# Patient Record
Sex: Male | Born: 1940 | Race: Asian | Hispanic: No | Marital: Married | Smoking: Never smoker
Health system: Southern US, Community
[De-identification: ages and names within clinical notes are randomized; demographics above are authoritative.]

## PROBLEM LIST (undated history)

## (undated) DIAGNOSIS — E039 Hypothyroidism, unspecified: Secondary | ICD-10-CM

## (undated) DIAGNOSIS — E7800 Pure hypercholesterolemia, unspecified: Secondary | ICD-10-CM

## (undated) DIAGNOSIS — I10 Essential (primary) hypertension: Secondary | ICD-10-CM

## (undated) DIAGNOSIS — I2510 Atherosclerotic heart disease of native coronary artery without angina pectoris: Secondary | ICD-10-CM

## (undated) DIAGNOSIS — E119 Type 2 diabetes mellitus without complications: Secondary | ICD-10-CM

## (undated) DIAGNOSIS — D496 Neoplasm of unspecified behavior of brain: Secondary | ICD-10-CM

## (undated) DIAGNOSIS — J45909 Unspecified asthma, uncomplicated: Secondary | ICD-10-CM

## (undated) HISTORY — DX: Neoplasm of unspecified behavior of brain: D49.6

## (undated) HISTORY — PX: CARDIAC CATHETERIZATION: SHX172

## (undated) HISTORY — PX: TONSILLECTOMY: SUR1361

## (undated) HISTORY — PX: INCISION AND DRAINAGE: SHX5863

---

## 2005-03-26 DIAGNOSIS — D496 Neoplasm of unspecified behavior of brain: Secondary | ICD-10-CM

## 2005-03-26 HISTORY — DX: Neoplasm of unspecified behavior of brain: D49.6

## 2005-05-18 ENCOUNTER — Ambulatory Visit: Payer: Self-pay | Admitting: Gastroenterology

## 2005-06-25 ENCOUNTER — Emergency Department: Payer: Self-pay | Admitting: Emergency Medicine

## 2005-06-26 ENCOUNTER — Other Ambulatory Visit: Payer: Self-pay

## 2005-08-10 ENCOUNTER — Encounter: Payer: Self-pay | Admitting: Family Medicine

## 2005-08-24 ENCOUNTER — Encounter: Payer: Self-pay | Admitting: Family Medicine

## 2006-03-09 ENCOUNTER — Ambulatory Visit: Payer: Self-pay | Admitting: Neurosurgery

## 2006-08-20 ENCOUNTER — Encounter: Payer: Self-pay | Admitting: Family Medicine

## 2006-08-25 ENCOUNTER — Encounter: Payer: Self-pay | Admitting: Family Medicine

## 2006-08-28 ENCOUNTER — Ambulatory Visit: Payer: Self-pay | Admitting: Family Medicine

## 2006-09-24 ENCOUNTER — Encounter: Payer: Self-pay | Admitting: Family Medicine

## 2009-10-17 ENCOUNTER — Ambulatory Visit: Payer: Self-pay | Admitting: Family Medicine

## 2010-06-27 ENCOUNTER — Emergency Department: Payer: Self-pay | Admitting: Emergency Medicine

## 2010-10-30 ENCOUNTER — Ambulatory Visit: Payer: Self-pay | Admitting: Family Medicine

## 2011-11-06 ENCOUNTER — Ambulatory Visit: Payer: Self-pay | Admitting: Family Medicine

## 2011-11-09 ENCOUNTER — Encounter: Payer: Self-pay | Admitting: Family Medicine

## 2011-11-25 ENCOUNTER — Ambulatory Visit: Payer: Self-pay | Admitting: Family Medicine

## 2011-11-25 ENCOUNTER — Encounter: Payer: Self-pay | Admitting: Family Medicine

## 2011-11-29 ENCOUNTER — Ambulatory Visit: Payer: Self-pay | Admitting: Family Medicine

## 2011-12-25 ENCOUNTER — Ambulatory Visit: Payer: Self-pay | Admitting: Family Medicine

## 2012-05-27 ENCOUNTER — Ambulatory Visit: Payer: Self-pay | Admitting: Family Medicine

## 2012-05-27 LAB — CREATININE, SERUM
EGFR (African American): >60
EGFR (Non-African Amer.): >60

## 2013-11-23 ENCOUNTER — Ambulatory Visit: Payer: Self-pay

## 2014-02-04 ENCOUNTER — Other Ambulatory Visit: Payer: Self-pay | Admitting: Urology

## 2014-02-09 ENCOUNTER — Encounter (HOSPITAL_COMMUNITY): Payer: Self-pay | Admitting: *Deleted

## 2014-02-09 NOTE — Progress Notes (Signed)
Left message on cell phone to go to dr hedrick to obtain new EKG as his last was dated 02.2014.  Pt prefers dr Alex Lambert instead of coming to coming to [place].    Instructed to go on 11.18.2015 if at all possible so that we can get read EKG from doctor

## 2014-02-11 NOTE — Progress Notes (Signed)
Patient is scheduled to have lithotripsy first thing Monday am. He has called Short Stay department multiple times today with various questions related to procedure. Majority of questions answered. There were specific questions he had about his xrays he wanted to speak to Dr. Karsten Ro about. Patient stated he had left message with Dr. Simone Curia office and would very much like to talk to him as soon as possible. This RN called and spoke to [facility] with Dr. Karsten Ro and shared above information and patient's phone number. She stated she would call patient and address his concerns.

## 2014-02-15 ENCOUNTER — Encounter (HOSPITAL_COMMUNITY)
Admission: RE | Disposition: A | Discharge status: Home / Self Care | Payer: Self-pay | Source: Ambulatory Visit | Attending: Urology

## 2014-02-15 ENCOUNTER — Encounter (HOSPITAL_COMMUNITY): Payer: Self-pay | Admitting: General Practice

## 2014-02-15 ENCOUNTER — Ambulatory Visit (HOSPITAL_COMMUNITY)
Admission: RE | Admit: 2014-02-15 | Discharge: 2014-02-15 | Disposition: A | Discharge status: Home / Self Care | Payer: Medicare HMO | Source: Ambulatory Visit | Attending: Urology | Admitting: Urology

## 2014-02-15 ENCOUNTER — Ambulatory Visit (HOSPITAL_COMMUNITY): Payer: Medicare HMO

## 2014-02-15 DIAGNOSIS — E780 Pure hypercholesterolemia: Secondary | ICD-10-CM | POA: Diagnosis not present

## 2014-02-15 DIAGNOSIS — Z7982 Long term (current) use of aspirin: Secondary | ICD-10-CM | POA: Insufficient documentation

## 2014-02-15 DIAGNOSIS — Z87440 Personal history of urinary (tract) infections: Secondary | ICD-10-CM | POA: Insufficient documentation

## 2014-02-15 DIAGNOSIS — I10 Essential (primary) hypertension: Secondary | ICD-10-CM | POA: Insufficient documentation

## 2014-02-15 DIAGNOSIS — I252 Old myocardial infarction: Secondary | ICD-10-CM | POA: Diagnosis not present

## 2014-02-15 DIAGNOSIS — E119 Type 2 diabetes mellitus without complications: Secondary | ICD-10-CM | POA: Diagnosis not present

## 2014-02-15 DIAGNOSIS — E039 Hypothyroidism, unspecified: Secondary | ICD-10-CM | POA: Diagnosis not present

## 2014-02-15 DIAGNOSIS — N201 Calculus of ureter: Secondary | ICD-10-CM

## 2014-02-15 DIAGNOSIS — I2510 Atherosclerotic heart disease of native coronary artery without angina pectoris: Secondary | ICD-10-CM | POA: Insufficient documentation

## 2014-02-15 DIAGNOSIS — N132 Hydronephrosis with renal and ureteral calculous obstruction: Secondary | ICD-10-CM | POA: Diagnosis not present

## 2014-02-15 HISTORY — DX: Hypothyroidism, unspecified: E03.9

## 2014-02-15 HISTORY — DX: Essential (primary) hypertension: I10

## 2014-02-15 HISTORY — DX: Atherosclerotic heart disease of native coronary artery without angina pectoris: I25.10

## 2014-02-15 HISTORY — DX: Pure hypercholesterolemia, unspecified: E78.00

## 2014-02-15 LAB — GLUCOSE, CAPILLARY: Glucose-Capillary: 170 mg/dL — ABNORMAL HIGH (ref 70–99)

## 2014-02-15 SURGERY — LITHOTRIPSY, ESWL
Anesthesia: LOCAL | Laterality: Left

## 2014-02-15 MED ORDER — TAMSULOSIN HCL 0.4 MG PO CAPS: 0.4000 mg | ORAL_CAPSULE | ORAL | Status: DC

## 2014-02-15 MED ORDER — SODIUM CHLORIDE 0.9 % IV SOLN
INTRAVENOUS | Status: DC
  Administered 2014-02-15: 07:00:00 via INTRAVENOUS

## 2014-02-15 MED ORDER — DIPHENHYDRAMINE HCL 25 MG PO CAPS
25.0000 mg | ORAL_CAPSULE | ORAL | Status: AC
  Administered 2014-02-15: 25 mg via ORAL
  Filled 2014-02-15: qty 1

## 2014-02-15 MED ORDER — DIAZEPAM 5 MG PO TABS
10.0000 mg | ORAL_TABLET | ORAL | Status: AC
  Administered 2014-02-15: 10 mg via ORAL
  Filled 2014-02-15: qty 2

## 2014-02-15 MED ORDER — OXYCODONE-ACETAMINOPHEN 10-325 MG PO TABS: 1.0000 | ORAL_TABLET | ORAL | Status: DC | PRN

## 2014-02-15 NOTE — Discharge Instructions (Signed)

## 2014-02-15 NOTE — Op Note (Signed)
See [facility] OP note scanned into chart. Also because of the size, density, location and other factors that cannot be anticipated I feel this will likely be a staged procedure. This fact supersedes any indication in the scanned [facility] operative note to the contrary.  

## 2014-02-15 NOTE — H&P (Signed)
Alex Lambert is a 73 year old male with a left ureteral stone.   History of Present Illness A CT scan done on 11/23/13 revealed a 7 mm wide by 11 mm long stone in the left ureter overlying the sacrum. There was associated proximal hydronephrosis. The stone had Hounsfield units of 1300. In addition he was noted to have prostatic enlargement. A KUB done on 01/06/14 was read as showing a left distal ureteral stone. Images were not available for my independent review.  He told me that in 2/15 he was hospitalized in [place] for what was felt to be a heart condition. His heart checked out fine but an ultrasound at that time revealed a stone in the proximal ureter on the left hand side. When he returned to the [place] he saw Dr. Yves Lambert who we had seen previously for a scrotal abscess in 3/15 and at that time he recommended lithotripsy but also after completing the lithotripsy he had recommended microwave therapy of the prostate. The patient was not sure about undergoing microwave and sought a second opinion by a urologist at [facility] who felt that there was no indication for microwave therapy that was proposed but did recommend ureteroscopic management of the stone by placing a stent and maintaining the patient in the hospital for 3 days followed by discharge and then return for ureteroscopic management of the stone. Again the patient did not want to undergo stent placement and sought a third opinion. It was decided to proceed with lithotripsy and he was scheduled for that today but unfortunately this position was not in his insurance network and it would result in significant expense so he came to see me since I am in his network. He is not having any significant pain at this time. He is also not having any hematuria.   Past Medical History Problems  1. History of CAD (coronary artery disease) (I25.10) 2. History of hyperlipidemia (Z86.39) 3. History of hypertension (Z86.79) 4. History of hypothyroidism  (Z86.39) 5. History of nephrolithiasis (Z87.442) 6. History of type 2 diabetes mellitus (Z86.39) 7. 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 History of Cath Stent Placement 2. History of Tonsillectomy  Current Meds 1. Aspirin Low Dose 81 MG Oral Tablet;  Therapy: (Recorded:12Nov2015) to Recorded 2. Levothyroxine Sodium 88 MCG Oral Tablet;  Therapy: (Recorded:12Nov2015) to Recorded 3. Metoprolol Tartrate 25 MG Oral Tablet;  Therapy: (Recorded:12Nov2015) to Recorded 4. NexIUM CPDR;  Therapy: (Recorded:12Nov2015) to Recorded 5. Simvastatin 20 MG Oral Tablet;  Therapy: (Recorded:12Nov2015) to Recorded  Allergies Medication  1. No Known Drug Allergies  Family History Problems  1. Family history of Deceased : Father 2. Family history of hypertension (Z82.49) : Father 3. Family history of ischemic heart disease (Z82.49) : Father  Social History Problems    Denied: History of Alcohol use   Never a smoker  Review of Systems Genitourinary, constitutional, skin, eye, otolaryngeal, hematologic/lymphatic, cardiovascular, pulmonary, endocrine, musculoskeletal, gastrointestinal, neurological and psychiatric system(s) were reviewed and pertinent findings if present are noted and are otherwise negative.    Vitals Vital Signs Height: 5 ft 8 in Weight: 173 lb  BMI Calculated: 26.3 BSA Calculated: 1.92 Blood Pressure: 117 / 67 Heart Rate: 94  Physical Exam Constitutional: Well nourished and well developed . No acute distress.  ENT:. The ears and nose are normal in appearance.  Neck: The appearance of the neck is normal and no neck mass is present.  Pulmonary: No respiratory distress and normal respiratory  rhythm and effort.  Cardiovascular: Heart rate and rhythm are normal . No peripheral edema.  Abdomen: The abdomen is soft and nontender. No masses are palpated. No CVA tenderness. No hernias are palpable. No  hepatosplenomegaly noted.  Lymphatics: The femoral and inguinal nodes are not enlarged or tender.  Skin: Normal skin turgor, no visible rash and no visible skin lesions.  Neuro/Psych:. Mood and affect are appropriate.    Results/Data Urine  COLOR YELLOW  APPEARANCE CLEAR  SPECIFIC GRAVITY 1.025  pH 5.5  GLUCOSE 500 mg/dL BILIRUBIN NEG  KETONE NEG mg/dL BLOOD MOD  PROTEIN NEG mg/dL UROBILINOGEN 0.2 mg/dL NITRITE NEG  LEUKOCYTE ESTERASE NEG  SQUAMOUS EPITHELIAL/HPF NONE SEEN  WBC 0-2 WBC/hpf RBC 7-10 RBC/hpf BACTERIA NONE SEEN  CRYSTALS NONE SEEN  CASTS NONE SEEN   Old records or history reviewed: From Dr. [name] as above.  The following images/tracing/specimen were independently visualized:  CT scan as above.  The following clinical lab reports were reviewed:  UA: Red cells but was otherwise negative.  General usual, the patient will. The patient has no history kidney stones, or several months ago here a small black clot. He denies any gross hematuria. He is not having any flank. The patient has had a hysterectomy and a bladder suspension procedure. Pressure that her urine has foul-smelling or I don't as well as a patient had diagnosis detail. I told her that she is. Also, the patient did have which are concerning for a possible ureteral stone which could be causing well. Evaluate this nonetheless, the patient would benefit from physical therapy The patient.  The following radiology reports were reviewed: CT scan.    Assessment  We discussed the management of urinary stones. These options include observation, ureteroscopy, shockwave lithotripsy, and PCNL. We discussed which options are relevant to these particular stones. We discussed the natural history of stones as well as the complications of untreated stones and the impact on quality of life without treatment as well as with each of the above listed treatments. We also discussed the efficacy of each treatment in its ability to  clear the stone burden. With any of these management options I discussed the signs and symptoms of infection and the need for emergent treatment should these be experienced. For each option we discussed the ability of each procedure to clear the patient of their stone burden.    For observation I described the risks which include but are not limited to silent renal damage, life-threatening infection, need for emergent surgery, failure to pass stone, and pain.    For ureteroscopy I described the risks which include heart attack, stroke, pulmonary embolus, death, bleeding, infection, damage to contiguous structures, positioning injury, ureteral stricture, ureteral avulsion, ureteral injury, need for ureteral stent, inability to perform ureteroscopy, need for an interval procedure, inability to clear stone burden, stent discomfort and pain.    For shockwave lithotripsy I described the risks which include arrhythmia, kidney contusion, kidney hemorrhage, need for transfusion, long-term risk of diabetes or hypertension, back discomfort, flank ecchymosis, flank abrasion, inability to break up stone, inability to pass stone fragments, Steinstrasse, infection associated with obstructing stones, need for different surgical procedure, need for repeat shockwave lithotripsy, and death.    He told me that what I had told him today was exactly what he was told by the urologist who he had seen most recently. Having discussed all of the options he has elected to proceed with lithotripsy.   Plan  1. He's going to stop  his aspirin in preparation for his lithotripsy.  2. He is scheduled for lithotripsy of his left ureteral stone.

## 2014-09-08 ENCOUNTER — Other Ambulatory Visit: Payer: Self-pay | Admitting: Unknown Physician Specialty

## 2014-09-08 DIAGNOSIS — M899 Disorder of bone, unspecified: Secondary | ICD-10-CM

## 2014-09-15 ENCOUNTER — Encounter
Admission: RE | Admit: 2014-09-15 | Discharge: 2014-09-15 | Disposition: A | Discharge status: Home / Self Care | Payer: PPO | Source: Ambulatory Visit | Attending: Unknown Physician Specialty | Admitting: Unknown Physician Specialty

## 2014-09-15 DIAGNOSIS — M899 Disorder of bone, unspecified: Secondary | ICD-10-CM | POA: Diagnosis not present

## 2014-09-15 HISTORY — DX: Type 2 diabetes mellitus without complications: E11.9

## 2014-09-15 MED ORDER — TECHNETIUM TC 99M MEDRONATE IV KIT
25.0000 | PACK | Freq: Once | INTRAVENOUS | Status: AC | PRN
  Administered 2014-09-15: 22.86 via INTRAVENOUS

## 2014-10-19 ENCOUNTER — Ambulatory Visit: Payer: PPO | Attending: Orthopedic Surgery

## 2014-10-19 DIAGNOSIS — M79601 Pain in right arm: Secondary | ICD-10-CM | POA: Diagnosis not present

## 2014-10-19 DIAGNOSIS — R531 Weakness: Secondary | ICD-10-CM | POA: Insufficient documentation

## 2014-10-19 NOTE — Patient Instructions (Signed)
Improved exercise technique, movement at target joints, use of target muscles after mod verbal, visual, tactile cues.   

## 2014-10-19 NOTE — Therapy (Signed)
[facility] [address] [place], [address] Phone: [phone]   Fax:  [phone]  Physical Therapy Evaluation  Patient Details  Name: Alex Lambert MRN: [id number] Date of Birth: 24-Aug-1940 Referring Provider:  Watt Climes, PA  Encounter Date: 10/19/2014      PT End of Session - 10/19/14 0943    Visit Number 1   Number of Visits 9   Date for PT Re-Evaluation 11/18/14   Authorization Type 1   Authorization Time Period of 10   PT Start Time [phone]   PT Stop Time 1101   PT Time Calculation (min) 78 min   Activity Tolerance Patient tolerated treatment well   Behavior During Therapy [facility] for tasks assessed/performed      Past Medical History  Diagnosis Date  . Hypothyroidism   . Hypertension   . Hypercholesterolemia   . Coronary artery disease     stents placed 2002  . Diabetes mellitus without complication     Past Surgical History  Procedure Laterality Date  . Cardiac catheterization      2 stents  . Incision and drainage      scrotum  2014  . Tonsillectomy      There were no vitals filed for this visit.  Visit Diagnosis:  Right arm pain - Plan: PT plan of care cert/re-cert  Weakness - Plan: PT plan of care cert/re-cert      Subjective Assessment - 10/19/14 0946    Subjective R arm pain at worst 6/10 (when he uses his R arm). Pt stops aggravating activities which relieves the pain. The more he performs the activities, the worse it gets.    Pertinent History Pt states that he had a bone scan for his R arm. There is a growth at the bone marrow but not cancerous. Pt states that his R arm pain might be due to the growth. Pt also states that he had to stop doing push-ups 2 months ago. Pain worsened when performing push-ups. Had to stop doing his arm work outs due to R arm pain since 6 weeks. Took celebrex for a week which did not help. Has been taking ibuprofen 200 mg which did not help. R  arm bothers him when he brushes his teeth, shaving with his electric shaver, lifting his window to let fresh air in.  No pain when he does not use his arm. Raising his R arm bothers it. Pt states having neck pain off and on but does not have significant pain. Performs chin tucks and cervical rotation at times. Had significant neck pain 10 years ago without radicular symptoms and had PT which helped. Has not had significant neck pain since then.  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  Pt is R hand dominant.    Patient Stated Goals Pt expresses desire to brush his teeth, shave, and open his window better.    Currently in Pain? No/denies   Multiple Pain Sites No            [facility] PT Assessment - 10/19/14 1007    Assessment   Medical Diagnosis R arm pain   Onset Date/Surgical Date 10/12/14   Hand Dominance Right   Precautions   Precaution Comments No known precautoins   Restrictions   Other Position/Activity Restrictions No known restrictions   Balance Screen   Has the patient fallen in the past 6  months No   Has the patient had a decrease in activity level because of a fear of falling?  No   Is the patient reluctant to leave their home because of a fear of falling?  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 No pain with restisted R wrist flexion. (+) nerural tension median nerve. (+) neural tension with reproduction of forearm symptoms R ulnar nerve, (+ ) neural tension with reproduction of anterior arm pain with radial nerve. (-) VBI, (-) alar ligament test.    Posture/Postural Control   Posture Comments Bilaterally protracted shoulders and neck   AROM   Overall AROM Comments Full bilateral shoulder flexion and abduction. Slightly limited R shoulder functional  ER and IR. Pt states supination increases his symptoms   Right Elbow Flexion --   Right Elbow Extension --   Left Elbow Flexion --   Left Elbow Extension --   Cervical Extension WFL, possible reproduction of symptoms   Cervical - Right Side [facility], with possible R UE symptoms   Cervical - Left Rotation WFL with possible reproduction of symptoms   Strength   Overall Strength Comments R wrist flexion 4/5, no pain, R wrist extension 4+/5 no pain   Right Elbow Flexion 4/5  some symptoms with concentric contraction, slight symptoms with eccentric contraction   Right Elbow Extension 4+/5   Left Elbow Flexion 4+/5   Left Elbow Extension 5/5   Palpation   Palpation comment Decreased mobility bilateral first rib           [facility] Adult PT Treatment/Exercise - 10/19/14 1104    Exercises   Other Exercises  Directed patient with R elbow flexion with supination then extension with pronation 10x2, R first rib stretch with cervical rotation 10x2 turning each direction, T-band R shoulder extension 10x2 (reviewed and given as part of his home exercise program; pt demonstrated and verbalized understanding.            PT Education - 10/19/14 1943    Education provided Yes   Education Details ther-ex, plan of care   Person(s) Educated Patient   Methods Explanation;Demonstration;Tactile cues;Verbal cues   Comprehension Verbalized understanding;Returned demonstration             PT Long Term Goals - 10/19/14 2002    PT LONG TERM GOAL #1   Title Patient will be independent with his HEP to decrease R arm pain and improve ability to brush his teeth, open the window, shave with his R hand.    Time 5   Period Weeks   Status New   PT LONG TERM GOAL #2   Title Patient will have a decrease in R UE pain to 3/10 or less at worst to improve ability to use his R hand to brush his teeth, shave, and open windows.    Time 5   Period Weeks   Status New   PT LONG TERM GOAL #3   Title Patient will  report decreased difficulty  using his R UE to brush his teeth, shave, open the window, and lifting or carrying items as a demonstration of improved function.    Time 5   Period Weeks   Status New  Plan - 2014-10-28 1109    Clinical Impression Statement Patient is a 74 year old male who came to physical therapy secondary to R arm pain. He also presents with reproduction of symptoms with ulnar nerve and radial nerve neural tension testing, and concentric biceps/brachioradialis activation; no TTP R UE, decreased strength,  bilaterally protracted shoulders and neck, and difficulty performing functional tasks such as brushing his teeth, shaving, and opening his windows secondary to his symptoms. Patient will benefit from skilled physical therapy intervention to address the aforementioned deficits.    Pt will benefit from skilled therapeutic intervention in order to improve on the following deficits Pain;Decreased strength   Rehab Potential Good   PT Frequency 2x / week   PT Duration 6 weeks  5 weeks   PT Treatment/Interventions Therapeutic exercise;Manual techniques;Therapeutic activities;Iontophoresis 4mg /ml Dexamethasone;Moist Heat;Electrical Stimulation;Cryotherapy;Patient/family education;Ultrasound   PT Next Visit Plan neural flossing, manual therapy, scapular strengthening, modalities PRN   Consulted and Agree with Plan of Care Patient          G-Codes - October 28, 2014 1957    Functional Assessment Tool Used clinical presentation, pt interview   Functional Limitation Carrying, moving and handling objects   Carrying, Moving and Handling Objects Current Status (P7357) At least 20 percent but less than 40 percent impaired, limited or restricted   Carrying, Moving and Handling Objects Goal Status (I9784) At least 1 percent but less than 20 percent impaired, limited or restricted       Problem List There are no active problems to display for this  patient.   Sadeen Wiegel 10/28/2014, 8:11 PM  [place] [address] [place], [address] Phone: [phone]   Fax:  [phone]

## 2014-10-20 ENCOUNTER — Ambulatory Visit: Payer: Medicare HMO

## 2014-10-20 ENCOUNTER — Ambulatory Visit: Payer: PPO

## 2014-11-02 ENCOUNTER — Ambulatory Visit: Payer: PPO | Attending: Orthopedic Surgery

## 2014-11-02 DIAGNOSIS — R531 Weakness: Secondary | ICD-10-CM | POA: Diagnosis present

## 2014-11-02 DIAGNOSIS — M79601 Pain in right arm: Secondary | ICD-10-CM | POA: Insufficient documentation

## 2014-11-02 NOTE — Patient Instructions (Signed)
Gave standing R biceps stretch 30 seconds x 3 as part of his HEP. Discontinued R shoulder extension with yellow band. Modified R UE radial nerve floss to not perform R biceps flexion. Pt demonstrated and verbalized understanding.   Improved exercise technique, movement at target joints, use of target muscles after mod verbal, visual, tactile cues.

## 2014-11-02 NOTE — Therapy (Signed)
[facility] [address], [place], [address] Phone: [phone]   Fax:  [phone]  Physical Therapy Treatment  Patient Details  Name: Alex Lambert MRN: [id number] Date of Birth: 12-31-1940 Referring Provider:  Watt Climes, PA  Encounter Date: 11/02/2014      PT End of Session - 11/02/14 1613    Visit Number 2   Number of Visits 9   Date for PT Re-Evaluation 11/18/14   Authorization Type 2   Authorization Time Period of 10   PT Start Time 1613   PT Stop Time 1710   PT Time Calculation (min) 57 min   Activity Tolerance Patient tolerated treatment well   Behavior During Therapy [facility] for tasks assessed/performed      Past Medical History  Diagnosis Date  . Hypothyroidism   . Hypertension   . Hypercholesterolemia   . Coronary artery disease     stents placed 2002  . Diabetes mellitus without complication     Past Surgical History  Procedure Laterality Date  . Cardiac catheterization      2 stents  . Incision and drainage      scrotum  2014  . Tonsillectomy      There were no vitals filed for this visit.  Visit Diagnosis:  Right arm pain  Weakness      Subjective Assessment - 11/02/14 1615    Subjective Pt states that he thinks picking up his 72 year old grandson during his trip to [place] might have aggravated his R arm pain.  0/10 R at rest currently. 6/10 when brushing his teeth.    Patient Stated Goals Pt expresses desire to brush his teeth, shave, and open his window better.    Currently in Pain? No/denies   Multiple Pain Sites No          [facility] Adult PT Treatment/Exercise - 11/02/14 1643    Exercises   Other Exercises  Directed patient with supine R cervical side bend 10x2 with PT assist, supine R UE median nerve neural floss (right cervical side bend with R forearm extension in ER position) 10x3, standing bilateral scapular retraction resisting yellow band 10x3 with 5  second holds, seated R forearm supination using 4 lbs 10x3 to neutral, seated R wrist extension resisting 4 lbs 10x3, R biceps stretch 30 seconds x 6 (given as part of HEP, pt demonstrated and verbalized understanding for 30 seconds x 3), standing R shoulder ER isometrics against wall 6x5 seconds.   Manual Therapy   Manual therapy comments Caudal glide to R first rib grade 3- to 3+            PT Education - 11/02/14 1724    Education provided Yes   Education Details ther-ex, HEP   Person(s) Educated Patient   Methods Explanation;Demonstration;Tactile cues;Verbal cues;Handout   Comprehension Verbalized understanding;Returned demonstration             PT Long Term Goals - 10/19/14 2002    PT LONG TERM GOAL #1   Title Patient will be independent with his HEP to decrease R arm pain and improve ability to brush his teeth, open the window, shave with his R hand.    Time 5   Period Weeks   Status New   PT LONG TERM GOAL #2   Title Patient will have a decrease in R UE pain to 3/10 or less at worst to improve ability to use his R hand  to brush his teeth, shave, and open windows.    Time 5   Period Weeks   Status New   PT LONG TERM GOAL #3   Title Patient will report decreased difficulty  using his R UE to brush his teeth, shave, open the window, and lifting or carrying items as a demonstration of improved function.    Time 5   Period Weeks   Status New           Plan - 11/02/14 1703    Clinical Impression Statement R anterior arm ache with activites involving activation of biceps muscle. Difficulty with coordination with R median nerve glide in supine. No symptoms with R biceps stretch.    Pt will benefit from skilled therapeutic intervention in order to improve on the following deficits Pain;Decreased strength   Rehab Potential Good   PT Frequency 2x / week   PT Duration 6 weeks   PT Treatment/Interventions Therapeutic exercise;Manual techniques;Therapeutic  activities;Iontophoresis 4mg /ml Dexamethasone;Moist Heat;Electrical Stimulation;Cryotherapy;Patient/family education;Ultrasound   PT Next Visit Plan neural flossing, manual therapy, scapular strengthening, modalities PRN   Consulted and Agree with Plan of Care Patient        Problem List There are no active problems to display for this patient.   Orpha Dain 11/02/2014, 5:25 PM  [place] [facility] [address], [place], [address] Phone: [phone]   Fax:  [phone]

## 2014-11-04 ENCOUNTER — Ambulatory Visit: Payer: PPO

## 2014-11-09 ENCOUNTER — Ambulatory Visit: Payer: PPO

## 2014-11-09 DIAGNOSIS — M79601 Pain in right arm: Secondary | ICD-10-CM

## 2014-11-09 DIAGNOSIS — R531 Weakness: Secondary | ICD-10-CM

## 2014-11-09 NOTE — Therapy (Signed)
[facility] [address] [place], [address] Phone: [phone]   Fax:  [phone]  Physical Therapy Treatment  Patient Details  Name: Alex Lambert MRN: [id number] Date of Birth: 20-Sep-1940 Referring Provider:  Watt Climes, PA  Encounter Date: 11/09/2014      PT End of Session - 11/09/14 1519    Visit Number 3   Number of Visits 9   Date for PT Re-Evaluation 11/18/14   Authorization Type 3   Authorization Time Period of 10   PT Start Time 1519   PT Stop Time 1620   PT Time Calculation (min) 61 min   Activity Tolerance Patient tolerated treatment well   Behavior During Therapy [facility] for tasks assessed/performed      Past Medical History  Diagnosis Date  . Hypothyroidism   . Hypertension   . Hypercholesterolemia   . Coronary artery disease     stents placed 2002  . Diabetes mellitus without complication     Past Surgical History  Procedure Laterality Date  . Cardiac catheterization      2 stents  . Incision and drainage      scrotum  2014  . Tonsillectomy      There were no vitals filed for this visit.  Visit Diagnosis:  Right arm pain  Weakness      Subjective Assessment - 11/09/14 1520    Subjective R arm is about the same. Also has pain in his R leg in which crossing his R leg increases his R LE ache. R leg hurts when he goes up and down steps. Pain in R leg originates from his R rear end to lateral thigh, lateral leg (L5 dermatome). R leg began just a few days ago unknown method of injury.  Pt also states that his gym routine also consists of using the back extension machine and the seated hip extension machine.    Patient Stated Goals Pt expresses desire to brush his teeth, shave, and open his window better.    Currently in Pain? No/denies  No pain at rest. Symptoms with raising his arm  and bending at his elbow.    Multiple Pain Sites No          [facility] Adult PT  Treatment/Exercise - 11/09/14 1546    Exercises   Other Exercises  Directed patient with supine R cervical side bend 5x, supine R median nerve floss 10x3, standing R median nerve floss 10x3, seated R forearm supination resisting 3 lbs 10x3, seated R forearm pronation resisting 3 lbs 10x3, R wrist extension 3 lbs 10x3, standing R shoulder extension resisting yellow band 10x3 (no grip), R shoulder ER isometrics 3x5 seconds, seated R upper trap stretch 10x5 seconds (reviewed and given as part of his HEP; pt demonstrated and verbalized understanding). Pt was also recommended to stop performing the seated back extension machine and seated hip abduction machine at the gym to see if it helps decrease his R hip and LE pain. Pt verbalized understanding. Increased time secondary to pt difficulty with coordinating cervical side bending with R arm movements during median nerve neural floss as well as difficulty with proper R cervical side bend.           PT Education - 11/09/14 1646    Education provided Yes   Education Details ther-ex, HEP   Person(s) Educated Patient   Methods Demonstration;Tactile cues;Verbal cues;Handout   Comprehension Verbalized understanding;Returned demonstration  PT Long Term Goals - 10/19/14 2002    PT LONG TERM GOAL #1   Title Patient will be independent with his HEP to decrease R arm pain and improve ability to brush his teeth, open the window, shave with his R hand.    Time 5   Period Weeks   Status New   PT LONG TERM GOAL #2   Title Patient will have a decrease in R UE pain to 3/10 or less at worst to improve ability to use his R hand to brush his teeth, shave, and open windows.    Time 5   Period Weeks   Status New   PT LONG TERM GOAL #3   Title Patient will report decreased difficulty  using his R UE to brush his teeth, shave, open the window, and lifting or carrying items as a demonstration of improved function.    Time 5   Period Weeks   Status New                Plan - 11/09/14 1647    Clinical Impression Statement Stiffness with R cervical side bending compared to L cervical side bending. R UE symptoms with ER isometrics and with active use of biceps.    Pt will benefit from skilled therapeutic intervention in order to improve on the following deficits Pain;Decreased strength   Rehab Potential Good   PT Frequency 2x / week   PT Duration 6 weeks   PT Treatment/Interventions Therapeutic exercise;Manual techniques;Therapeutic activities;Iontophoresis 4mg /ml Dexamethasone;Moist Heat;Electrical Stimulation;Cryotherapy;Patient/family education;Ultrasound   PT Next Visit Plan neural flossing, manual therapy, scapular strengthening, modalities PRN   Consulted and Agree with Plan of Care Patient        Problem List There are no active problems to display for this patient.   Joneen Boers PT, DPT  11/09/2014, 4:53 PM  [facility] [address], [place], [address] Phone: [phone]   Fax:  [phone]

## 2014-11-09 NOTE — Patient Instructions (Addendum)
  Flexibility: Upper Trapezius Stretch   Gently grasp right side of head while reaching behind back with other hand. Tilt head away until a gentle stretch is felt. Hold _5___ seconds. Repeat _10___ times per set. Do __3__ sets per session. Do ___1_ sessions per day.  http://orth.exer.us/340   Copyright  VHI. All rights reserved.    Pt was also instructed to hold off on his back extension machine and seated hip abduction machine routine to see if it helps decrease his R LE pain. Pt verbalized understanding.    Improved exercise technique, movement at target joints and tissues, use of target muscles after mod verbal, visual, tactile cues.

## 2014-11-11 ENCOUNTER — Ambulatory Visit: Payer: PPO

## 2014-11-11 DIAGNOSIS — M79601 Pain in right arm: Secondary | ICD-10-CM | POA: Diagnosis not present

## 2014-11-11 DIAGNOSIS — R531 Weakness: Secondary | ICD-10-CM

## 2014-11-11 NOTE — Patient Instructions (Addendum)
D2 Flexion / Extension (Single Arm)   Do not hold onto ball. Start with R thumb towards pocket. Bring thumb up and back at end of motion. Reverse motion to return. Repeat __10 times.  Do __3  sets per session.  Copyright  VHI. All rights reserved.    MEDIAN NERVE: Flossing I   With right elbow bent and palm facing up as if holding a tray, head tilted away. Simultaneously straighten arm and tilt head toward involved shoulder. Do _3__ sets of _10__ repetitions per session. Do __1_ sessions per day.  Copyright  VHI. All rights reserved.   Extremity Flexion (Hook-Lying)   Bring both arms back.  Repeat __10__ times per set. Do _3___ sets per session. Do __1__ sessions per day.  http://orth.exer.us/1088   Copyright  VHI. All rights reserved.   Improved exercise technique, median nerve flossing, movement at target joints, use of target muscles after mod verbal, visual, tactile cues.

## 2014-11-11 NOTE — Therapy (Signed)
[facility] [address], [place], [address] Phone: [phone]   Fax:  [phone]  Physical Therapy Treatment  Patient Details  Name: ROBERTA KELLY MRN: [id number] Date of Birth: [birth year] Referring Provider:  Dereck Leep, MD  Encounter Date: 11/11/2014      PT End of Session - 11/11/14 1605    Visit Number 4   Number of Visits 9   Date for PT Re-Evaluation 11/18/14   Authorization Type 4   Authorization Time Period of 10   PT Start Time 1605   PT Stop Time 1651   PT Time Calculation (min) 46 min   Activity Tolerance Patient tolerated treatment well   Behavior During Therapy [facility] for tasks assessed/performed      Past Medical History  Diagnosis Date  . Hypothyroidism   . Hypertension   . Hypercholesterolemia   . Coronary artery disease     stents placed 2002  . Diabetes mellitus without complication     Past Surgical History  Procedure Laterality Date  . Cardiac catheterization      2 stents  . Incision and drainage      scrotum  2014  . Tonsillectomy      There were no vitals filed for this visit.  Visit Diagnosis:  Right arm pain  Weakness      Subjective Assessment - 11/11/14 1606    Subjective Pt states that the R arm is the same. Its pretty stubborn. No pain at rest. Might be going out of town next 2 weeks.   Patient Stated Goals Pt expresses desire to brush his teeth, shave, and open his window better.    Currently in Pain? No/denies   Pain Location Arm  arm and forearm   Pain Orientation Right   Pain Descriptors / Indicators Aching   Aggravating Factors  Brushing teeth, lifting window   Multiple Pain Sites No           [facility] Adult PT Treatment/Exercise - 11/11/14 1625    Exercises   Other Exercises  Directed patient with supine bilateral shoulder flexion with towel roll behind upper thoracic spine (to promote thoracic extension) 10x3, supine manal R  median nerve floss (by PT) 30x, supine PNF D2 flexion 10x3, supine R UE self median nerve floss 10x3, standing R UE medial nerve neural floss with cervical side bending 10x3. Reviewed addition of today's exercises to add to his HEP (please see pt instructions). Pt demonstrated and verbalized understanding.  Pt reviewed his LE workout routine at gym which involved a lot of hamstring strengthening exercises. Pt was recommended to just perform 2 of those hamstring exercises (such as not to have muscle imbalances which might play a factor in his R LE symptoms). Pt verbalized understanding.    Manual Therapy   Manual therapy comments Gentle mobilization with movement to promote L rotation at C7 10x3, and T1 10x; soft tissue mobilization to L lower cervical paraspinal muscles  Palpation: R T1 and C7 cervical rotation           PT Education - 11/11/14 1939    Education provided Yes   Education Details ther-ex, HEP.    Person(s) Educated Patient   Methods Demonstration;Tactile cues;Verbal cues;Handout   Comprehension Verbalized understanding;Returned demonstration;Verbal cues required;Tactile cues required             PT Long Term Goals - 10/19/14 2002    PT LONG TERM GOAL #1  Title Patient will be independent with his HEP to decrease R arm pain and improve ability to brush his teeth, open the window, shave with his R hand.    Time 5   Period Weeks   Status New   PT LONG TERM GOAL #2   Title Patient will have a decrease in R UE pain to 3/10 or less at worst to improve ability to use his R hand to brush his teeth, shave, and open windows.    Time 5   Period Weeks   Status New   PT LONG TERM GOAL #3   Title Patient will report decreased difficulty  using his R UE to brush his teeth, shave, open the window, and lifting or carrying items as a demonstration of improved function.    Time 5   Period Weeks   Status New            Plan - 11/11/14 1944    Clinical Impression Statement  Pt was educated that inconsistent attendance to physical therapy sessions makes progress challenging. Needed adjustments to HEP of treatment plan can be implemented more quickly if pt is able to attend 2x per week instead of missing sessions 1 week to 2 weeks at a time. Pt verbalized understanding.    Tolerated session without R UE pain. Upon palpation: slight R rotated C7 and T1 vertebrae which may play a factor on median nerve related symptoms. Limited R and L cervical side bend AROM.     Pt will benefit from skilled therapeutic intervention in order to improve on the following deficits Pain;Decreased strength   Rehab Potential Good   Clinical Impairments Affecting Rehab Potential per radiology report addendum on 09/21/14: radiolucent lesion within the distal R humerus   PT Frequency 2x / week   PT Duration 6 weeks   PT Treatment/Interventions Therapeutic exercise;Manual techniques;Therapeutic activities;Iontophoresis 4mg /ml Dexamethasone;Moist Heat;Electrical Stimulation;Cryotherapy;Patient/family education;Ultrasound   PT Next Visit Plan neural flossing, manual therapy, scapular strengthening, modalities PRN   Consulted and Agree with Plan of Care Patient        Problem List There are no active problems to display for this patient.   Joneen Boers PT, DPT   11/11/2014, 7:57 PM  [facility] [address] [place], [address] Phone: [phone]   Fax:  [phone]

## 2014-11-16 ENCOUNTER — Ambulatory Visit: Payer: PPO

## 2014-11-16 DIAGNOSIS — M79601 Pain in right arm: Secondary | ICD-10-CM

## 2014-11-16 DIAGNOSIS — R531 Weakness: Secondary | ICD-10-CM

## 2014-11-16 NOTE — Patient Instructions (Addendum)
Improved exercise technique, movement at target joints, use of target muscles after mod verbal, visual, tactile cues.   Gave R hand ball squeeze 10x3 with 5 second holds with R arm in slight flexion and elbow bent as part of his HEP. Pt demonstrated and verbalized understanding.

## 2014-11-16 NOTE — Therapy (Signed)
[facility] [address] [place], [address] Phone: [phone]   Fax:  [phone]  Physical Therapy Treatment  Patient Details  Name: Alex Lambert MRN: [id number] Date of Birth: 1940/08/27 Referring Provider:  Watt Climes, PA  Encounter Date: 11/16/2014      PT End of Session - 11/16/14 1522    Visit Number 5   Number of Visits 9   Date for PT Re-Evaluation 11/18/14   Authorization Type 5   Authorization Time Period of 10   PT Start Time 1522   PT Stop Time 1608   PT Time Calculation (min) 46 min   Activity Tolerance Patient tolerated treatment well   Behavior During Therapy [facility] for tasks assessed/performed      Past Medical History  Diagnosis Date  . Hypothyroidism   . Hypertension   . Hypercholesterolemia   . Coronary artery disease     stents placed 2002  . Diabetes mellitus without complication   . 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 Cardiac catheterization      2 stents  . Incision and drainage      scrotum  2014  . Tonsillectomy      There were no vitals filed for this visit.  Visit Diagnosis:  Right arm pain - Plan: PT plan of care cert/re-cert  Weakness - Plan: PT plan of care cert/re-cert      Subjective Assessment - 11/16/14 1523    Subjective Pt states that his arm feels about the same. Does the new exercises (median nerve exercises) at home. Might have to cancel Thursday's session   Patient Stated Goals Pt expresses desire to brush his teeth, shave, and open his window better.    Currently in Pain? No/denies  currently; however, 5/10  with aggravating factors   Pain Location --  arm and forearm   Pain Orientation Right   Pain Descriptors / Indicators Aching   Aggravating Factors  brushing teeth, lifting window   Multiple Pain Sites No           [facility] Adult PT  Treatment/Exercise - 11/16/14 1545    Exercises   Other Exercises  Directed patient with seated manually resisted R scapular retraction 10x5 seconds, R scapular retraction resisting red band 10x3 with 5 second holds, hand ball squeeze 10x5 seconds for 3 sets; static R hand ball squeeze (to fatigue) then with isometric triceps extension (elbow bent to 90 degrees; manual resistance from PT), then with R arm in flexion .   No symptoms with ball squeeze with R arm in slight flexion   Manual Therapy   Manual therapy comments Gentle mobilization with movement to promote L rotation at C7 10x4, and T1 10x4; soft tissue mobilization to L upper trap muscle   Palpation: R T1 and C7 cervical rotation           PT Education - 11/16/14 1740    Education provided Yes   Education Details ther-ex, HEP   Person(s) Educated Patient   Methods Demonstration;Tactile cues;Verbal cues;Explanation   Comprehension Returned demonstration;Verbalized understanding             PT Long Term Goals - 11/16/14 1809    PT LONG TERM GOAL #1   Title Patient will be independent with his HEP to decrease R arm pain and improve ability to  brush his teeth, open the window, shave with his R hand.    Time 6   Period Weeks   Status On-going   PT LONG TERM GOAL #2   Title Patient will have a decrease in R UE pain to 3/10 or less at worst to improve ability to use his R hand to brush his teeth, shave, and open windows.    Time 6   Period Weeks   Status On-going   PT LONG TERM GOAL #3   Title Patient will report decreased difficulty  using his R UE to brush his teeth, shave, open the window, and lifting or carrying items as a demonstration of improved function.    Time 6   Period Weeks   Status On-going           Plan - 11/16/14 1744    Clinical Impression Statement Patient demonstrates limited progress  currently in which inconsistent attendance to physical therapy may play a factor.  Reproduction of anterior  arm and forearm ache with tonic contraction of forearm and  wrist muscles with elbow bent and palm up but symptoms decreased with  slight flexion of R arm (to promote posterior glide at glenohumeral joint)  suggesting possible glenohumeral involvement. Patient also demonstrates  neural tension of R medial nerve along the area of ache. Patient will  benefit from continued skilled physical therapy services to decrease R UE  pain and to improve ability to use his R arm to brush his teeth, lift  items, and open the window.    Pt will benefit from skilled therapeutic intervention in order to improve on the following deficits Pain;Decreased strength   Rehab Potential Good   Clinical Impairments Affecting Rehab Potential per radiology report addendum on 09/21/14: radiolucent lesion within the distal R humerus; hx of lesions; unable to consistently attend physical therapy sessions   PT Frequency 2x / week   PT Duration 6 weeks   PT Treatment/Interventions Therapeutic exercise;Manual techniques;Therapeutic activities;Cryotherapy;Patient/family education   PT Next Visit Plan R UE strengthening, neural flossing, manual therapy, scapular strengthening,   Consulted and Agree with Plan of Care Patient      Problem List There are no active problems to display for this patient.   Thank you for your referral.    Joneen Boers PT, DPT  11/16/2014, 6:11 PM  [facility] [address] [place], [address] Phone: [phone]   Fax:  [phone]

## 2014-11-18 ENCOUNTER — Ambulatory Visit: Payer: PPO

## 2014-12-06 ENCOUNTER — Other Ambulatory Visit: Payer: Self-pay | Admitting: Family Medicine

## 2014-12-06 DIAGNOSIS — D329 Benign neoplasm of meninges, unspecified: Secondary | ICD-10-CM

## 2014-12-07 ENCOUNTER — Ambulatory Visit: Payer: PPO | Attending: Orthopedic Surgery

## 2014-12-07 DIAGNOSIS — M79601 Pain in right arm: Secondary | ICD-10-CM | POA: Insufficient documentation

## 2014-12-07 DIAGNOSIS — R531 Weakness: Secondary | ICD-10-CM | POA: Insufficient documentation

## 2014-12-07 DIAGNOSIS — M25551 Pain in right hip: Secondary | ICD-10-CM | POA: Insufficient documentation

## 2014-12-07 DIAGNOSIS — M79604 Pain in right leg: Secondary | ICD-10-CM | POA: Diagnosis present

## 2014-12-08 NOTE — Therapy (Addendum)
[facility] [address] [place], [address] Phone: [phone]   Fax:  [phone]  Physical Therapy Treatment  Patient Details  Name: Alex Lambert MRN: [id number] Date of Birth: Dec 23, 1940 Referring Provider:  [name], MD  Encounter Date: 12/07/2014      PT End of Session - 12/07/14 1602    Visit Number 1   Number of Visits 9   Date for PT Re-Evaluation 01/06/15   Authorization Type 1   Authorization Time Period of 10   PT Start Time 1602   PT Stop Time 1658   PT Time Calculation (min) 56 min   Activity Tolerance Patient tolerated treatment well   Behavior During Therapy [name] for tasks assessed/performed      Past Medical History  Diagnosis Date  . Hypothyroidism   . Hypertension   . Hypercholesterolemia   . Coronary artery disease     stents placed 2002  . Diabetes mellitus without complication   . 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 Cardiac catheterization      2 stents  . Incision and drainage      scrotum  2014  . Tonsillectomy      There were no vitals filed for this visit.  Visit Diagnosis:  Right hip pain - Plan: PT plan of care cert/re-cert  Right leg pain - Plan: PT plan of care cert/re-cert  Pain in joint, pelvic region and thigh, right - Plan: PT plan of care cert/re-cert  Weakness - Plan: PT plan of care cert/re-cert      Subjective Assessment - 12/07/14 1604    Subjective Pt states that he has completely stopped the weights at the gym. Currently doing the elliptical and the bike at the gym. Pt states unable to perform head  tilting exercise to the L due to neck pain. Pt states that his R elbow is 5% better.  Currently doing the HEP that does not involve cerivical side bending.  Currently wants to hold off on the R elbow and focus on the R LE.    Pertinent History Patient  states that his R LE bothers him more than his R elbow. Pt currently feels R LE pain along the L5 dermatome. Difficulty sleeping on his R side secondary to pain. Difficulty driving secondary to R LE pain. R LE pain began a month ago. Able walk about 4 miles but his R LE hurts. No known mechanism  of injury.  Able to sleep on his back, L side, and stomach. Increased  symptoms with lying down on his back and crossing his R leg over his L.    Patient Stated Goals Get rid of the R LE pain completely   Currently in Pain? Yes   Pain Score --  current: 0/10; 4/10 when sitting on driver seat using breaks, 9/10 when going up 3 flights of stairs.   Pain Location --  R LE   Pain Orientation Right   Aggravating Factors  sitting crossing his legs, walking, laying on his R side, driving,    Pain Relieving Factors sitting on chair, not doing anything   Multiple Pain Sites [name]            [name] PT Assessment - 12/07/14 1618    Assessment   Medical Diagnosis R leg pain  Onset Date/Surgical Date 10/25/14  One month ago   Hand Dominance Right   Prior Therapy No known therapy for current condition   Precautions   Precaution Comments No known precautoins   Restrictions   Other Position/Activity Restrictions No known restrictions   Balance Screen   Has the patient fallen in the past 6 months No   Has the patient had a decrease in activity level because of a fear of falling?  No   Is the patient reluctant to leave their home because of a fear of falling?  No   Prior Function   Vocation Retired   Biomedical scientist PLOF: better able to lie down on his R side, sit in a car and drive, sit, cross his R leg over his left, negotiate stairs   Observation/Other Assessments   Observations (+) repeated flexion test with reproduction of symptoms, (+) piriformis test R LE initially. Long sit test suggests posterior nutation of L innominate.    Posture/Postural Control   Posture Comments Movement  preference to L1/L2, L3/L4. R pelvic rotation, L trunk rotation at L3/L4. Increased lordosis. R hip in ER, bilateral foot pronation. R iliac crest higher.    AROM   Overall AROM Comments Supine SLR hip flexion R 47 degrees, L 41 degrees,    Lumbar Flexion Very limited with reproduction of symptoms.    Lumbar Extension Limited   Lumbar - Right Side Bend limited   Lumbar - Left Side Bend limited   Lumbar - Right Rotation limited   Lumbar - Left Rotation [name]   Strength   Overall Strength Comments Hip abduction 4/5 bilaterally; bilateral glute max extension 4/5. Bilateral knee flexion 4+/5.    Palpation   Palpation comment TTP with reproduction of symptoms at posterior greater trochanter on R, and R posterior hip at piriformis area, R proximal sciatic nerve area.           PT Education - 12/07/14 2328    Education provided Yes   Education Details Plan of care   Person(s) Educated Patient   Methods Explanation   Comprehension Verbalized understanding     Old goal/discharge       PT Long Term Goals - 12/09/14 1333    PT LONG TERM GOAL #1   Title Patient will be independent with his HEP to decrease R arm pain and improve ability to brush his teeth, open the window, shave with his R hand.    Time 6   Period Weeks   Status Achieved   PT LONG TERM GOAL #2   Title Patient will have a decrease in R UE pain to 3/10 or less at worst to improve ability to use his R hand to brush his teeth, shave, and open windows.    Time 6   Period Weeks   Status On-going   PT LONG TERM GOAL #3   Title Patient will report decreased difficulty  using his R UE to brush his teeth, shave, open the window, and lifting or carrying items as a demonstration of improved function.    Time 6   Period Weeks   Status On-going      New Goals      PT Long Term Goals - 12/07/14 2341    PT LONG TERM GOAL #1   Title Patient will be independent with his HEP to improve hip strength and decrease R LE pain.     Time 4   Period Weeks   Status New   PT  LONG TERM GOAL #2   Title Patient will have a decrease in R LE pain to 5/10 or less at worst to improve ability to ascend and descend stairs as well as to use his R LE to drive.    Time 4   Period Weeks   Status New   PT LONG TERM GOAL #3   Title Patient will improve bilateral hip strength by 1/2 MMT grade to promote ability to perform functional tasks such as negotiating stairs with less R LE pain.    Time 4   Period Weeks   Status New            Plan - 2014/12/16 04-Jul-2327    Clinical Impression Statement Patient is a 74 year old male who came to physical therapy secondary to R LE pain which began about a month ago (August 2016). He also presents with limited lumbar AROM, TTP to R piriformis area and R greater trochanter,  altered posture,  reproduction of symptoms with repeated lumbar flexion and R piriformis tests suggesting disc and piriformis involvment , bilateral hip weakness, and difficulty performing tasks such as using his R LE to drive, negotiating stairs, as well as tolerating positions such as lying on his R side, and sitting with his R leg crossed over his L. Patient will benefit from skilled physical therapy services to address the aforementioned deficits.    Pt will benefit from skilled therapeutic intervention in order to improve on the following deficits Postural dysfunction;Decreased strength;Pain   Rehab Potential Good   Clinical Impairments Affecting Rehab Potential unable to consistently attend physical therapy sessions   PT Frequency 2x / week   PT Duration 4 weeks   PT Treatment/Interventions Manual techniques;Therapeutic exercise;Therapeutic activities;Iontophoresis 4mg /ml Dexamethasone;Electrical Stimulation;Cryotherapy;Patient/family education;Neuromuscular re-education;Traction   PT Next Visit Plan Decrease piriformis tension, improve glute strength, lumbar mobility, gentle lumbar extension, thoracic extension   Consulted and  Agree with Plan of Care Patient          G-Codes - 2014/12/16 Jul 03, 2309    Functional Assessment Tool Used clinical presentation, pt interview   Functional Limitation Carrying, moving and handling objects;Mobility: Walking and moving around   Mobility: Walking and Moving Around Current Status [phone]) At least 40 percent but less than 60 percent impaired, limited or restricted   Mobility: Walking and Moving Around Goal Status [phone]) At least 1 percent but less than 20 percent impaired, limited or restricted   Carrying, Moving and Handling Objects Current Status (T0160) At least 20 percent but less than 40 percent impaired, limited or restricted   Carrying, Moving and Handling Objects Goal Status (F0932) At least 1 percent but less than 20 percent impaired, limited or restricted   Carrying, Moving and Handling Objects Discharge Status [phone]) At least 20 percent but less than 40 percent impaired, limited or restricted      Problem List There are no active problems to display for this patient.  Thank you for your referral.  Joneen Boers PT, DPT   12/08/2014, 8:15 AM  [facility] [address], [place], [address] Phone: [phone]   Fax:  [phone]

## 2014-12-09 ENCOUNTER — Ambulatory Visit: Payer: PPO

## 2014-12-09 DIAGNOSIS — M79604 Pain in right leg: Secondary | ICD-10-CM

## 2014-12-09 DIAGNOSIS — R531 Weakness: Secondary | ICD-10-CM

## 2014-12-09 DIAGNOSIS — M79601 Pain in right arm: Secondary | ICD-10-CM

## 2014-12-09 DIAGNOSIS — M25551 Pain in right hip: Secondary | ICD-10-CM

## 2014-12-09 NOTE — Therapy (Signed)
[place] [address], [place], [address] Phone: [phone]   Fax:  [phone]  Physical Therapy Treatment  Patient Details  Name: Alex Lambert MRN: [id number] Date of Birth: 08-Mar-1941 Referring Provider:  [name], MD  Encounter Date: 12/09/2014      PT End of Session - 12/09/14 1604    Visit Number 2   Number of Visits 9   Date for PT Re-Evaluation 01/06/15   Authorization Type 2   Authorization Time Period of 10   PT Start Time 1604   PT Stop Time 1648   PT Time Calculation (min) 44 min   Activity Tolerance Patient tolerated treatment well   Behavior During Therapy [facility] for tasks assessed/performed      Past Medical History  Diagnosis Date  . Hypothyroidism   . Hypertension   . Hypercholesterolemia   . Coronary artery disease     stents placed 2002  . Diabetes mellitus without complication   . 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 Cardiac catheterization      2 stents  . Incision and drainage      scrotum  2014  . Tonsillectomy      There were no vitals filed for this visit.  Visit Diagnosis:  Right hip pain  Right leg pain  Pain in joint, pelvic region and thigh, right  Weakness  Right arm pain      Subjective Assessment - 12/09/14 1606    Subjective 3/10 R hip pain currently. Symptoms increase when sitting in the car. Pt also states that he is better able to open his window with his R arm.    Patient Stated Goals Get rid of the R LE pain completely   Currently in Pain? Yes   Pain Score 3    Multiple Pain Sites Yes        Objectives:   There-ex: Directed patient with sitting with lumbar towel roll x3 min (decresaed R hip pain from 3/10 to 2/10)  Seated hip adduction pillow squeeze 10x3 with 5 second holds. (decreased back pain to 1 to 0/10)  Standing hip abduction at  machine 20 lbs 10x for L, 10x3 for R,   Standing hip extension at machine 20 lbs 10x3,   Standing bilateral ankle DF/PF on rocker board 2 min (for gastroc and neural mobility),  Standing bilateral scapular retraction with red band (to promote thoracic and gentle lumbar extension) 10x3,   Pt education on lumbar disc and radiating symptoms R LE.  Directed patient with forward wedding march holding onto 5 lb weight each hand 32 ft x 4 (to promote glute med strength)  Side stepping holding onto 5 lb weight 32 ft x 2 each direction.   Reviewed HEP with pt. Pt demonstrated and verbalized understanding.     Improved exercise technique, movement at target joints, use of target muscles after mod verbal, visual, tactile cues.                            PT Education - 12/09/14 1642    Education provided Yes   Education Details ther-ex, HEP   Person(s) Educated Patient   Methods Explanation;Demonstration;Tactile cues;Verbal cues;Handout   Comprehension Verbalized understanding;Returned demonstration             PT  Long Term Goals - 12/09/14 1916    PT LONG TERM GOAL #1   Title Patient will be independent with his HEP to improve hip strength and decrease R LE pain.    Time 4   Period Weeks   Status On-going   PT LONG TERM GOAL #2   Title Patient will have a decrease in R LE pain to 5/10 or less at worst to improve ability to ascend and descend stairs as well as to use his R LE to drive.    Time 4   Period Weeks   Status On-going   PT LONG TERM GOAL #3   Title Patient will improve bilateral hip strength by 1/2 MMT grade to promote ability to perform functional tasks such as negotiating stairs with less R LE pain.    Time 4   Period Weeks   Status On-going               Plan - 12/09/14 1644    Clinical Impression Statement Decreased back pain with gentle lumbar extension and exercises that decrease activation of R piriformis muscle.    Pt will  benefit from skilled therapeutic intervention in order to improve on the following deficits Postural dysfunction;Decreased strength;Pain   Rehab Potential Good   Clinical Impairments Affecting Rehab Potential unable to consistently attend physical therapy sessions   PT Frequency 2x / week   PT Duration 4 weeks   PT Treatment/Interventions Manual techniques;Therapeutic exercise;Therapeutic activities;Iontophoresis 4mg /ml Dexamethasone;Electrical Stimulation;Cryotherapy;Patient/family education;Neuromuscular re-education;Traction   PT Next Visit Plan Decrease piriformis tension, improve glute strength, lumbar mobility, gentle lumbar extension, thoracic extension   Consulted and Agree with Plan of Care Patient        Problem List There are no active problems to display for this patient.  Joneen Boers PT, DPT  12/09/2014, 7:19 PM  [facility] [address] [place], [address] Phone: [phone]   Fax:  [phone]

## 2014-12-09 NOTE — Patient Instructions (Addendum)
Sitting on a chair place a small towel roll behind your back.   Sitting on a chair squeeze pillow between your knees. Hold for 5 seconds 10 times. Perform 3 sets.   Also gave standing bilateral shoulder extension with scapular retraction 10x3 resisting red band (red band provided) as part of his HEP. Pt demonstrated and verbalized understanding.

## 2014-12-10 ENCOUNTER — Ambulatory Visit
Admission: RE | Admit: 2014-12-10 | Discharge: 2014-12-10 | Disposition: A | Discharge status: Home / Self Care | Payer: PPO | Source: Ambulatory Visit | Attending: Family Medicine | Admitting: Family Medicine

## 2014-12-10 DIAGNOSIS — D329 Benign neoplasm of meninges, unspecified: Secondary | ICD-10-CM

## 2014-12-10 MED ORDER — GADOBENATE DIMEGLUMINE 529 MG/ML IV SOLN
20.0000 mL | Freq: Once | INTRAVENOUS | Status: AC | PRN
  Administered 2014-12-10: 16 mL via INTRAVENOUS

## 2014-12-16 ENCOUNTER — Ambulatory Visit: Payer: PPO

## 2014-12-16 DIAGNOSIS — M79604 Pain in right leg: Secondary | ICD-10-CM

## 2014-12-16 DIAGNOSIS — M25551 Pain in right hip: Secondary | ICD-10-CM | POA: Diagnosis not present

## 2014-12-16 DIAGNOSIS — R531 Weakness: Secondary | ICD-10-CM

## 2014-12-16 NOTE — Therapy (Signed)
[facility] [address], [place], [address] Phone: [phone]   Fax:  [phone]  Physical Therapy Treatment  Patient Details  Name: Alex Lambert MRN: [id number] Date of Birth: 1940/07/04 Referring Provider:  [name], MD  Encounter Date: 12/16/2014      PT End of Session - 12/16/14 1602    Visit Number 3   Number of Visits 9   Date for PT Re-Evaluation 01/06/15   Authorization Type 3   Authorization Time Period of 10   PT Start Time 1603   PT Stop Time 1657   PT Time Calculation (min) 54 min   Activity Tolerance Patient tolerated treatment well   Behavior During Therapy [facility] for tasks assessed/performed      Past Medical History  Diagnosis Date  . Hypothyroidism   . Hypertension   . Hypercholesterolemia   . Coronary artery disease     stents placed 2002  . Diabetes mellitus without complication   . 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 Cardiac catheterization      2 stents  . Incision and drainage      scrotum  2014  . Tonsillectomy      There were no vitals filed for this visit.  Visit Diagnosis:  Right hip pain  Right leg pain  Pain in joint, pelvic region and thigh, right  Weakness      Subjective Assessment - 12/16/14 1604    Subjective 6-7/10 R hip pain at worst when in the car for an extensed period of time. Tried the lumbar towel roll in low back in his small car. The big car, his seat is comfortable and R LE does not bother him. The newer and smaller car bothers his R LE. 1-2/10 R hip currently. The lumbar towel roll helps.    Patient Stated Goals Get rid of the R LE pain completely   Currently in Pain? Yes   Pain Score 2       Objectives:   There-ex: Directed patient with sitting with lumbar towel roll x3 min,  Seated hip adduction pillow squeeze 10x3 with 5 second  holds.   Reviewed home exercises with pt which included R radial nerve floss, bilateral shoulder extension with red band and scap retract, R biceps stretch, hand fist/squeeze.  Assessed pt car seat. Instructed pt with ergonomics: elevated car seat, and flexed back rest, use of lumbar towel roll,    Standing hip abduction at machine 25 lbs for 10x3 for R,   Standing hip extension at machine 25 lbs 10x, then 40 lbs 10x2 R LE (slight R thigh discomfort afterwards)  T-band side step red band 32 ft x2 each direction,  Supine bridge with red band resistance 10x3.  Improved exercise technique, movement at target joints, use of target muscles after mod verbal, visual, tactile cues.    Decreased R hip pain when car chair was raised, and back rest leaned forward and with lumbar towel roll.            PT Education - 12/16/14 1754    Education provided Yes   Education Details ther-ex, HEP   Person(s) Educated Patient   Methods Explanation;Demonstration;Tactile cues;Verbal cues;Handout   Comprehension Verbalized understanding;Returned demonstration             PT Long Term Goals - 12/09/14 1916  PT LONG TERM GOAL #1   Title Patient will be independent with his HEP to improve hip strength and decrease R LE pain.    Time 4   Period Weeks   Status On-going   PT LONG TERM GOAL #2   Title Patient will have a decrease in R LE pain to 5/10 or less at worst to improve ability to ascend and descend stairs as well as to use his R LE to drive.    Time 4   Period Weeks   Status On-going   PT LONG TERM GOAL #3   Title Patient will improve bilateral hip strength by 1/2 MMT grade to promote ability to perform functional tasks such as negotiating stairs with less R LE pain.    Time 4   Period Weeks   Status On-going               Plan - 12/16/14 1754    Clinical Impression Statement Decreased R hip pain in car with elevation of car seat, flexion of seat back rest, and use of  towel lumbar support. Some R thigh and hip discomfort after standing hip abduction using the machine.    Pt will benefit from skilled therapeutic intervention in order to improve on the following deficits Postural dysfunction;Decreased strength;Pain   Rehab Potential Good   Clinical Impairments Affecting Rehab Potential unable to consistently attend physical therapy sessions   PT Frequency 2x / week   PT Duration 4 weeks   PT Treatment/Interventions Manual techniques;Therapeutic exercise;Therapeutic activities;Iontophoresis 4mg /ml Dexamethasone;Electrical Stimulation;Cryotherapy;Patient/family education;Neuromuscular re-education;Traction   PT Next Visit Plan Decrease piriformis tension, improve glute strength, lumbar mobility, gentle lumbar extension, thoracic extension   Consulted and Agree with Plan of Care Patient        Problem List There are no active problems to display for this patient.  Joneen Boers PT, DPT  12/16/2014, 7:27 PM  [place] [address], [place], [address] Phone: [phone]   Fax:  [phone]

## 2014-12-16 NOTE — Patient Instructions (Signed)
Bridge   Lie back, legs bent red band around knees, knees shoulder width apart. Press hips up.  Repeat _10___ times. Do __3__ sessions per day.  Copyright  V[employer]. All rights reserved.    Adduction: Hip - Knees Together (Sitting)   Sit with towel roll between knees. Push knees together. Hold for _5__ seconds. Repeat _10__ times. Do __3_ times a day.  Copyright  V[employer]. All rights reserved.

## 2014-12-21 ENCOUNTER — Ambulatory Visit: Payer: PPO

## 2014-12-28 ENCOUNTER — Ambulatory Visit: Payer: PPO | Attending: Orthopedic Surgery

## 2014-12-28 DIAGNOSIS — M79604 Pain in right leg: Secondary | ICD-10-CM | POA: Insufficient documentation

## 2014-12-28 DIAGNOSIS — R531 Weakness: Secondary | ICD-10-CM | POA: Insufficient documentation

## 2014-12-28 DIAGNOSIS — M25551 Pain in right hip: Secondary | ICD-10-CM | POA: Insufficient documentation

## 2014-12-30 ENCOUNTER — Ambulatory Visit: Payer: PPO

## 2015-01-11 ENCOUNTER — Ambulatory Visit: Payer: PPO

## 2015-01-11 DIAGNOSIS — M79604 Pain in right leg: Secondary | ICD-10-CM

## 2015-01-11 DIAGNOSIS — R531 Weakness: Secondary | ICD-10-CM

## 2015-01-11 DIAGNOSIS — M25551 Pain in right hip: Secondary | ICD-10-CM | POA: Diagnosis not present

## 2015-01-11 NOTE — Therapy (Signed)
[facility] [address] [place], [address] Phone: [phone]   Fax:  [phone]  Physical Therapy Treatment And Progress Report  Patient Details  Name: Alex Lambert MRN: [id number] Date of Birth: 08-Feb-1941 Referring Provider: [name], MD  Encounter Date: 01/11/2015      PT End of Session - 01/11/15 1610    Visit Number 5   Number of Visits 17   Date for PT Re-Evaluation 02/10/15   Authorization Type 5   Authorization Time Period of 10   PT Start Time 1610  Patient arrived late   PT Stop Time 1649   PT Time Calculation (min) 39 min   Activity Tolerance Patient tolerated treatment well   Behavior During Therapy [facility] for tasks assessed/performed      Past Medical History  Diagnosis Date  . Hypothyroidism   . Hypertension   . Hypercholesterolemia   . Coronary artery disease     stents placed 2002  . Diabetes mellitus without complication   . 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 Cardiac catheterization      2 stents  . Incision and drainage      scrotum  2014  . Tonsillectomy      There were no vitals filed for this visit.  Visit Diagnosis:  Right hip pain - Plan: PT plan of care cert/re-cert  Right leg pain - Plan: PT plan of care cert/re-cert  Pain in joint, pelvic region and thigh, right - Plan: PT plan of care cert/re-cert  Weakness - Plan: PT plan of care cert/re-cert      Subjective Assessment - 01/11/15 1612    Subjective Pt states the R forearm and leg is better. Has been doing his exercises (standing bilateral scapular retraction and supine bridge). Pt also adds that not going to the gym has help decrease his pain. 2/10 R LE pain currently, 3/10 at most.  Has another trip next week starting 01/20/15 until 02/04/15   Patient Stated Goals Get rid of the R LE pain completely    Currently in Pain? Yes   Pain Score 2    Multiple Pain Sites Yes            [facility] PT Assessment - 01/11/15 1618    Assessment   Referring Provider [name], MD   Strength   Overall Strength Comments Hip abduction R 4/5, L 4+/5; glute max extension R 4+/5, L 4+/5     There-ex  Directed patient with S/L manually resisted hip abduction, prone glute max extension. Reviewed progress/current status with hip strength with pt.  Directed patient with lateral (R and L) walk resisting black theratube 2x5 holding resistance at chest (reviewed and given as part of his HEP; pt demonstrated and verabalized understanding) Backward walk resisting black theratube 5x2 holding resistance at chest S/L hip abduction 10x each LE   Improved exercise technique, movement at target joints, use of target muscles after mod verbal, visual, tactile cues.     Pt has improved bilateral glute max and L glute med strength and decreased overall R LE pain since initial evaluation. Pt still demonstrates R LE pain and discomfort which still makes exercising at the gym, as well as traveling in a car or plane difficult. Pt would benefit from continued skilled physical therapy services to decrease pain, and to improve  strength and ability to perform his ADLs.                   PT Education - 01/11/15 1810    Education provided Yes   Education Details ther-ex, progress/current status with bilateral hip strength, HEP   Person(s) Educated Patient   Methods Explanation;Demonstration;Tactile cues;Verbal cues;Handout   Comprehension Verbalized understanding;Returned demonstration             PT Long Term Goals - 01/11/15 1617    PT LONG TERM GOAL #1   Title Patient will be independent with his HEP to improve hip strength and decrease R LE pain.    Time 4   Period Weeks   Status On-going   PT LONG TERM GOAL #2   Title Patient will have a decrease in R LE pain to 5/10 or less at worst to improve  ability to ascend and descend stairs as well as to use his R LE to drive.    Time 4   Period Weeks   Status Achieved   PT LONG TERM GOAL #3   Title Patient will improve bilateral hip strength by 1/2 MMT grade to promote ability to perform functional tasks such as negotiating stairs with less R LE pain.    Time 4   Period Weeks   Status Partially Met   PT LONG TERM GOAL #4   Title Patient will have a decrease in R LE pain to 2/10 or less at worst to improve ability to ascend and descend stairs as well as to use his R LE to drive.    Time 4   Period Weeks   Status New               Plan - 01/11/15 1812    Clinical Impression Statement Pt has improved bilateral glute max and L glute med strength and decreased overall R LE pain since initial evaluation. Pt still demonstrates R LE pain and discomfort which still makes exercising at the gym, as well as traveling in a car or plane difficult. Pt would benefit from continued skilled physical therapy services to decrease pain, and to improve strength and ability to perform his ADLs.    Pt will benefit from skilled therapeutic intervention in order to improve on the following deficits Postural dysfunction;Decreased strength;Pain   Rehab Potential Good   Clinical Impairments Affecting Rehab Potential unable to consistently attend physical therapy sessions   PT Frequency 2x / week   PT Duration 4 weeks   PT Treatment/Interventions Manual techniques;Therapeutic exercise;Therapeutic activities;Iontophoresis 53m/ml Dexamethasone;Electrical Stimulation;Cryotherapy;Patient/family education;Neuromuscular re-education;Traction   PT Next Visit Plan Decrease piriformis tension, improve glute strength, lumbar mobility, gentle lumbar extension, thoracic extension   Consulted and Agree with Plan of Care Patient        Problem List There are no active problems to display for this patient.  Thank you for your referral.  MJoneen BoersPT,  DPT   01/11/2015, 6:34 PM  [facility] [address] [place] [address]Phone: [phone]  Fax:  [phone] Name: KCORIN TILLYMRN: [id number] of Birth: 803-03-1940

## 2015-01-11 NOTE — Patient Instructions (Signed)
  Holding thera tube at chest height, step to the side 3 steps. Repeat 10 times, perform 2 sets. Do not let the band turn you.   Repeat for your other side.

## 2015-01-13 ENCOUNTER — Ambulatory Visit: Payer: PPO

## 2015-01-13 DIAGNOSIS — M79604 Pain in right leg: Secondary | ICD-10-CM

## 2015-01-13 DIAGNOSIS — M25551 Pain in right hip: Secondary | ICD-10-CM | POA: Diagnosis not present

## 2015-01-13 DIAGNOSIS — R531 Weakness: Secondary | ICD-10-CM

## 2015-01-13 NOTE — Therapy (Signed)
[facility] [address] [place], [address] Phone: [phone]   Fax:  [phone]  Physical Therapy Treatment  Patient Details  Name: Alex Lambert MRN: [id number] Date of Birth: March 30, 1940 Referring Provider: [name], MD  Encounter Date: 01/13/2015      PT End of Session - 01/13/15 1605    Visit Number 6   Number of Visits 17   Date for PT Re-Evaluation 02/10/15   Authorization Type 5   Authorization Time Period of 10   PT Start Time 3329   PT Stop Time 1650   PT Time Calculation (min) 44 min   Activity Tolerance Patient tolerated treatment well   Behavior During Therapy [facility] for tasks assessed/performed      Past Medical History  Diagnosis Date  . Hypothyroidism   . Hypertension   . Hypercholesterolemia   . Coronary artery disease     stents placed 2002  . Diabetes mellitus without complication   . 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 Cardiac catheterization      2 stents  . Incision and drainage      scrotum  2014  . Tonsillectomy      There were no vitals filed for this visit.  Visit Diagnosis:  Right hip pain  Right leg pain  Pain in joint, pelvic region and thigh, right  Weakness      Subjective Assessment - 01/13/15 1608    Subjective Pt states that the exercise with the black theraband caused an ache which stopped when he stopped the exercise. 4/10 R LE pain currently   Patient Stated Goals Get rid of the R LE pain completely   Currently in Pain? Yes   Pain Score 4     Pt adds that he will be out of town starting 01/19/15 to 11/ 11/16.   Pt also going to  [place] sometime in November until March 2016 Pt also requests a note to let people know about his back and R LE such as a PT note next visit.   Objectives:  There-ex  Directed patient with sitting with proper posture for  at least 2.5 min (decreased R LE pain),  Sitting with towel roll at lumbar spine,  Pt education of sciatic nerve, piriformis, and disc in relation to R LE symptoms, Directed patient with seated R hip IR 5x3  S/L hip abduction 10x2 each LE Reviewed additional HEP with pt (please see pt instructions). Pt demonstrated and verbalized understanding.    Improved exercise technique, movement at target joints, use of target muscles after mod verbal, visual, tactile cues.   Decreased symptoms after session, especially with exercises that promote gentle lumbar extension to decrease lumbar disc pressure to his nerve.                          PT Education - 01/13/15 1926    Education provided Yes   Education Details ther-ex. HEP   Person(s) Educated Patient   Methods Explanation;Demonstration;Tactile cues;Verbal cues;Handout   Comprehension Verbalized understanding;Returned demonstration             PT Long Term Goals - 01/11/15 1617    PT LONG TERM GOAL #1   Title Patient will be independent with his HEP to improve hip strength and decrease R LE pain.  Time 4   Period Weeks   Status On-going   PT LONG TERM GOAL #2   Title Patient will have a decrease in R LE pain to 5/10 or less at worst to improve ability to ascend and descend stairs as well as to use his R LE to drive.    Time 4   Period Weeks   Status Achieved   PT LONG TERM GOAL #3   Title Patient will improve bilateral hip strength by 1/2 MMT grade to promote ability to perform functional tasks such as negotiating stairs with less R LE pain.    Time 4   Period Weeks   Status Partially Met   PT LONG TERM GOAL #4   Title Patient will have a decrease in R LE pain to 2/10 or less at worst to improve ability to ascend and descend stairs as well as to use his R LE to drive.    Time 4   Period Weeks   Status New               Plan - 01/13/15 1927    Clinical Impression Statement Decreased symptoms  after session, especially with exercises that promote gentle lumbar extension to decrease lumbar disc pressure to his nerve.   Pt will benefit from skilled therapeutic intervention in order to improve on the following deficits Postural dysfunction;Decreased strength;Pain   Rehab Potential Good   Clinical Impairments Affecting Rehab Potential unable to consistently attend physical therapy sessions   PT Frequency 2x / week   PT Duration 4 weeks   PT Treatment/Interventions Manual techniques;Therapeutic exercise;Therapeutic activities;Iontophoresis 18m/ml Dexamethasone;Electrical Stimulation;Cryotherapy;Patient/family education;Neuromuscular re-education;Traction   PT Next Visit Plan Decrease piriformis tension, improve glute strength, lumbar mobility, gentle lumbar extension, thoracic extension   Consulted and Agree with Plan of Care Patient        Problem List There are no active problems to display for this patient.  MJoneen BoersPT, DPT   01/13/2015, 7:38 PM  [facility] [address] [place] [address]Phone: [phone]  Fax:  [phone] Name: Alex XIANGMRN: [id number] of Birth: [birth year]

## 2015-01-13 NOTE — Patient Instructions (Signed)
Abduction Lift    Lie on your side perpendicular to the bed.  Tighten muscles on outside of hip to raise limb. Hold ___0_ seconds. Repeat _10___ times. Do ___2_ sessions per day.  Copyright  VHI. All rights reserved.   Bridging    Stretch band with your knees shoulder width apart (not shown). Slowly raise buttocks from floor, keeping stomach tight. Repeat _10___ times per set. Do _3___ sets per session. Do _1___ sessions per day.  http://orth.exer.us/1096   Copyright  VHI. All rights reserved.

## 2015-01-18 ENCOUNTER — Ambulatory Visit: Payer: PPO

## 2015-01-18 DIAGNOSIS — R531 Weakness: Secondary | ICD-10-CM

## 2015-01-18 DIAGNOSIS — M25551 Pain in right hip: Secondary | ICD-10-CM

## 2015-01-18 DIAGNOSIS — M79604 Pain in right leg: Secondary | ICD-10-CM

## 2015-01-18 NOTE — Therapy (Signed)
[facility] [address] [place], [place], [address] Phone: [phone]   Fax:  [phone]  Physical Therapy Treatment  Patient Details  Name: Alex Lambert MRN: [id number] Date of Birth: 12/24/1940 Referring Provider: James Hedrick, MD  Encounter Date: 01/18/2015      PT End of Session - 01/18/15 1607    Visit Number 6  Correction for 01/11/15 and 01/13/15   Number of Visits 17   Date for PT Re-Evaluation 02/10/15   Authorization Type 6   Authorization Time Period of 10   PT Start Time 1605   PT Stop Time 1648   PT Time Calculation (min) 43 min   Activity Tolerance Patient tolerated treatment well   Behavior During Therapy [facility] for tasks assessed/performed      Past Medical History  Diagnosis Date  . Hypothyroidism   . Hypertension   . Hypercholesterolemia   . Coronary artery disease     stents placed 2002  . Diabetes mellitus without complication   . 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 Cardiac catheterization      2 stents  . Incision and drainage      scrotum  2014  . Tonsillectomy      There were no vitals filed for this visit.  Visit Diagnosis:  Right hip pain  Right leg pain  Pain in joint, pelvic region and thigh, right  Weakness      Subjective Assessment - 01/18/15 1619    Subjective Pt states that he feels so much better and that the pain is definitely going down. 2/10 current R LE pain. 3/10 at most.  R arm pain is 1-2/10. R arm bothering him less with brushing his teeth and shaving.  Pt back from trip February 04, 2015. May or may not be able to make it to his next session if they extendend  his stay during his current trip. Driving in his car is better as well.    Patient Stated Goals Get rid of the R LE pain completely   Currently in Pain? Yes   Pain Score 2    Multiple Pain Sites Yes        Objectives:  There-ex Directed patient with sitting on chair with lumbar towel roll x 2 min Seated R hip IR 5x3 Standing bilateral shoulder extension resisting green band 10x3, Supine bridge with green band resisting hip abduction/ER 10x3 S/L hip abduction 10x3 each LE Sit <> stand with green band resisting hip abduction/ER 10x from low mat table.   Pt education on disc, piriformis involvement, sciatic nerve, and L 5 nerve pathway, as well as how lumbar extension helps alleviate disc related injuries (such as sciatic symptoms and pressure at the L5 nerve pathway), and decreasing use of piriformis muscles helps decrease sciatic related symptoms .   Improved exercise technique, movement at target joints, use of target muscles after mod verbal, visual, tactile cues.   No complain of pain during gentle lumbar extension based exercises and glute med and max strengthening exercises to help decrease R piriformis muscle use. Patient demonstrates disc and piriformis involvement with his R LE symptoms. Patient making good progress towards goals of decreasing R LE pain and improving function.           [facility] PT Assessment - 01/18/15 0001    Strength     Overall Strength Comments Hip abduction R 4/5, L 4+/5; glute max extension R 4+/5, L 4+/5  measured 01/11/15                             PT Education - 01/18/15 1852    Education provided Yes   Education Details ther-ex; lumbar disc, piriformis involvement with symptoms (hand out provided with pictures of lumbar disc, piriformis, and sciatic nerve)   Person(s) Educated Patient   Methods Explanation;Demonstration;Tactile cues;Verbal cues;Handout   Comprehension Verbalized understanding;Returned demonstration             PT Long Term Goals - 01/18/15 1639    PT LONG TERM GOAL #1   Title Patient will be independent with his HEP to improve hip strength and decrease R LE pain.    Time 4   Period Weeks    Status On-going   PT LONG TERM GOAL #2   Title Patient will have a decrease in R LE pain to 5/10 or less at worst to improve ability to ascend and descend stairs as well as to use his R LE to drive.    Time 4   Period Weeks   Status Achieved   PT LONG TERM GOAL #3   Title Patient will improve bilateral hip strength by 1/2 MMT grade to promote ability to perform functional tasks such as negotiating stairs with less R LE pain.    Time 4   Period Weeks   Status Partially Met   PT LONG TERM GOAL #4   Title Patient will have a decrease in R LE pain to 2/10 or less at worst to improve ability to ascend and descend stairs as well as to use his R LE to drive.    Time 4   Period Weeks   Status On-going               Plan - 01/18/15 1640    Clinical Impression Statement No complain of pain during gentle lumbar extension based exercises and glute med and max strengthening exercises to help decrease R piriformis muscle use. Patient demonstrates disc and piriformis involvement with his R LE symptoms. Patient making good progress towards goals of decreasing R LE pain and improving function.    Pt will benefit from skilled therapeutic intervention in order to improve on the following deficits Postural dysfunction;Decreased strength;Pain   Rehab Potential Good   Clinical Impairments Affecting Rehab Potential unable to consistently attend physical therapy sessions   PT Frequency 2x / week   PT Duration 4 weeks   PT Treatment/Interventions Manual techniques;Therapeutic exercise;Therapeutic activities;Iontophoresis 70m/ml Dexamethasone;Electrical Stimulation;Cryotherapy;Patient/family education;Neuromuscular re-education;Traction   PT Next Visit Plan Decrease piriformis tension, improve glute strength, lumbar mobility, gentle lumbar extension, thoracic extension   Consulted and Agree with Plan of Care Patient        Problem List There are no active problems to display for this  patient.   MJoneen BoersPT, DPT   01/18/2015, 7:10 PM  [facility] [address] [place] [address]Phone: [phone]  Fax:  [phone] Name: KNORRIN SHREFFLERMRN: [id number] of Birth: 812/20/1942

## 2015-02-08 ENCOUNTER — Ambulatory Visit: Payer: PPO

## 2015-02-11 DIAGNOSIS — M4726 Other spondylosis with radiculopathy, lumbar region: Secondary | ICD-10-CM | POA: Insufficient documentation

## 2015-02-11 DIAGNOSIS — M9953 Intervertebral disc stenosis of neural canal of lumbar region: Secondary | ICD-10-CM | POA: Insufficient documentation

## 2015-02-11 DIAGNOSIS — M899 Disorder of bone, unspecified: Secondary | ICD-10-CM | POA: Insufficient documentation

## 2015-07-01 ENCOUNTER — Other Ambulatory Visit (HOSPITAL_COMMUNITY): Payer: Self-pay | Admitting: Neurosurgery

## 2015-07-01 DIAGNOSIS — D329 Benign neoplasm of meninges, unspecified: Secondary | ICD-10-CM

## 2015-07-25 ENCOUNTER — Ambulatory Visit: Payer: PPO

## 2015-08-11 ENCOUNTER — Ambulatory Visit
Admission: RE | Admit: 2015-08-11 | Discharge: 2015-08-11 | Disposition: A | Discharge status: Home / Self Care | Payer: PPO | Source: Ambulatory Visit | Attending: Neurosurgery | Admitting: Neurosurgery

## 2015-08-11 DIAGNOSIS — D329 Benign neoplasm of meninges, unspecified: Secondary | ICD-10-CM

## 2015-08-11 DIAGNOSIS — D320 Benign neoplasm of cerebral meninges: Secondary | ICD-10-CM | POA: Diagnosis not present

## 2015-08-11 MED ORDER — GADOBENATE DIMEGLUMINE 529 MG/ML IV SOLN
20.0000 mL | Freq: Once | INTRAVENOUS | Status: AC | PRN
  Administered 2015-08-11: 17 mL via INTRAVENOUS

## 2015-09-06 DIAGNOSIS — H53462 Homonymous bilateral field defects, left side: Secondary | ICD-10-CM | POA: Insufficient documentation

## 2015-12-28 ENCOUNTER — Other Ambulatory Visit: Payer: Self-pay | Admitting: Neurosurgery

## 2015-12-28 DIAGNOSIS — D329 Benign neoplasm of meninges, unspecified: Secondary | ICD-10-CM

## 2016-07-17 ENCOUNTER — Ambulatory Visit: Payer: PPO

## 2016-07-18 ENCOUNTER — Ambulatory Visit: Payer: PPO

## 2016-07-26 ENCOUNTER — Other Ambulatory Visit: Payer: Self-pay | Admitting: Family Medicine

## 2016-07-26 DIAGNOSIS — D329 Benign neoplasm of meninges, unspecified: Secondary | ICD-10-CM

## 2016-07-27 ENCOUNTER — Other Ambulatory Visit: Payer: Self-pay | Admitting: Family Medicine

## 2016-07-27 ENCOUNTER — Ambulatory Visit: Payer: Medicare HMO

## 2016-07-27 ENCOUNTER — Ambulatory Visit
Admission: RE | Admit: 2016-07-27 | Discharge: 2016-07-27 | Disposition: A | Discharge status: Home / Self Care | Payer: Medicare HMO | Source: Ambulatory Visit | Attending: Family Medicine | Admitting: Family Medicine

## 2016-07-27 DIAGNOSIS — D329 Benign neoplasm of meninges, unspecified: Secondary | ICD-10-CM | POA: Diagnosis not present

## 2016-07-27 MED ORDER — GADOBENATE DIMEGLUMINE 529 MG/ML IV SOLN
20.0000 mL | Freq: Once | INTRAVENOUS | Status: AC | PRN
  Administered 2016-07-27: 16 mL via INTRAVENOUS

## 2016-08-13 ENCOUNTER — Ambulatory Visit: Payer: PPO

## 2016-09-18 DIAGNOSIS — D329 Benign neoplasm of meninges, unspecified: Secondary | ICD-10-CM | POA: Insufficient documentation

## 2017-07-30 ENCOUNTER — Other Ambulatory Visit: Payer: Self-pay | Admitting: Family Medicine

## 2017-07-30 DIAGNOSIS — D329 Benign neoplasm of meninges, unspecified: Secondary | ICD-10-CM

## 2017-08-12 ENCOUNTER — Other Ambulatory Visit: Payer: Self-pay | Admitting: Neurosurgery

## 2017-08-12 ENCOUNTER — Ambulatory Visit: Payer: PPO

## 2017-08-12 DIAGNOSIS — D329 Benign neoplasm of meninges, unspecified: Secondary | ICD-10-CM

## 2017-08-13 ENCOUNTER — Ambulatory Visit
Admission: RE | Admit: 2017-08-13 | Discharge: 2017-08-13 | Disposition: A | Discharge status: Home / Self Care | Payer: Medicare HMO | Source: Ambulatory Visit | Attending: Neurosurgery | Admitting: Neurosurgery

## 2017-08-13 DIAGNOSIS — D329 Benign neoplasm of meninges, unspecified: Secondary | ICD-10-CM | POA: Insufficient documentation

## 2017-08-13 LAB — POCT I-STAT CREATININE: Creatinine, Ser: 1.1 mg/dL (ref 0.61–1.24)

## 2017-08-13 MED ORDER — GADOBENATE DIMEGLUMINE 529 MG/ML IV SOLN
20.0000 mL | Freq: Once | INTRAVENOUS | Status: AC | PRN
  Administered 2017-08-13: 17 mL via INTRAVENOUS

## 2017-09-09 ENCOUNTER — Ambulatory Visit: Payer: Self-pay

## 2017-09-10 ENCOUNTER — Other Ambulatory Visit: Payer: Self-pay | Admitting: Family Medicine

## 2017-09-10 DIAGNOSIS — Z87442 Personal history of urinary calculi: Secondary | ICD-10-CM

## 2017-09-10 DIAGNOSIS — R109 Unspecified abdominal pain: Secondary | ICD-10-CM

## 2017-09-13 ENCOUNTER — Ambulatory Visit: Payer: Medicare HMO

## 2017-09-16 ENCOUNTER — Ambulatory Visit
Admission: RE | Admit: 2017-09-16 | Discharge: 2017-09-16 | Disposition: A | Discharge status: Home / Self Care | Payer: Medicare HMO | Source: Ambulatory Visit | Attending: Family Medicine | Admitting: Family Medicine

## 2017-09-16 DIAGNOSIS — R109 Unspecified abdominal pain: Secondary | ICD-10-CM | POA: Diagnosis present

## 2017-09-16 DIAGNOSIS — K5790 Diverticulosis of intestine, part unspecified, without perforation or abscess without bleeding: Secondary | ICD-10-CM | POA: Diagnosis not present

## 2017-09-16 DIAGNOSIS — N281 Cyst of kidney, acquired: Secondary | ICD-10-CM | POA: Diagnosis not present

## 2017-09-16 DIAGNOSIS — Z87442 Personal history of urinary calculi: Secondary | ICD-10-CM

## 2017-09-16 DIAGNOSIS — K4090 Unilateral inguinal hernia, without obstruction or gangrene, not specified as recurrent: Secondary | ICD-10-CM | POA: Diagnosis not present

## 2017-09-16 DIAGNOSIS — N433 Hydrocele, unspecified: Secondary | ICD-10-CM | POA: Insufficient documentation

## 2017-10-14 ENCOUNTER — Ambulatory Visit: Payer: Self-pay

## 2017-10-14 DIAGNOSIS — N200 Calculus of kidney: Secondary | ICD-10-CM | POA: Insufficient documentation

## 2017-10-14 DIAGNOSIS — I10 Essential (primary) hypertension: Secondary | ICD-10-CM | POA: Insufficient documentation

## 2017-10-14 DIAGNOSIS — E039 Hypothyroidism, unspecified: Secondary | ICD-10-CM | POA: Insufficient documentation

## 2017-10-14 DIAGNOSIS — I214 Non-ST elevation (NSTEMI) myocardial infarction: Secondary | ICD-10-CM | POA: Insufficient documentation

## 2017-10-14 DIAGNOSIS — E785 Hyperlipidemia, unspecified: Secondary | ICD-10-CM | POA: Insufficient documentation

## 2017-10-14 DIAGNOSIS — I2510 Atherosclerotic heart disease of native coronary artery without angina pectoris: Secondary | ICD-10-CM | POA: Insufficient documentation

## 2017-10-14 DIAGNOSIS — E119 Type 2 diabetes mellitus without complications: Secondary | ICD-10-CM | POA: Insufficient documentation

## 2017-10-15 ENCOUNTER — Ambulatory Visit: Payer: Self-pay | Admitting: Urology

## 2017-11-11 ENCOUNTER — Encounter

## 2017-11-11 ENCOUNTER — Ambulatory Visit: Payer: Self-pay

## 2018-02-04 ENCOUNTER — Ambulatory Visit: Payer: Medicare HMO | Admitting: Physical Therapy

## 2018-02-06 ENCOUNTER — Ambulatory Visit: Payer: Medicare HMO | Admitting: Physical Therapy

## 2018-02-06 ENCOUNTER — Encounter: Payer: Medicare HMO | Admitting: Physical Therapy

## 2018-02-10 ENCOUNTER — Encounter: Payer: Medicare HMO | Admitting: Physical Therapy

## 2018-02-13 ENCOUNTER — Encounter: Payer: Medicare HMO | Admitting: Physical Therapy

## 2018-02-17 ENCOUNTER — Encounter: Payer: Medicare HMO | Admitting: Physical Therapy

## 2019-01-16 ENCOUNTER — Other Ambulatory Visit (HOSPITAL_COMMUNITY): Payer: Self-pay | Admitting: Neurosurgery

## 2019-01-16 ENCOUNTER — Other Ambulatory Visit: Payer: Self-pay | Admitting: Neurosurgery

## 2019-01-16 DIAGNOSIS — D329 Benign neoplasm of meninges, unspecified: Secondary | ICD-10-CM

## 2019-01-30 ENCOUNTER — Ambulatory Visit
Admission: RE | Admit: 2019-01-30 | Discharge: 2019-01-30 | Disposition: A | Discharge status: Home / Self Care | Payer: Medicare HMO | Source: Ambulatory Visit | Attending: Neurosurgery | Admitting: Neurosurgery

## 2019-01-30 ENCOUNTER — Other Ambulatory Visit: Payer: Self-pay

## 2019-01-30 DIAGNOSIS — D329 Benign neoplasm of meninges, unspecified: Secondary | ICD-10-CM | POA: Diagnosis present

## 2019-01-30 LAB — POCT I-STAT CREATININE: Creatinine, Ser: 0.9 mg/dL (ref 0.61–1.24)

## 2019-01-30 MED ORDER — GADOBUTROL 1 MMOL/ML IV SOLN
8.0000 mL | Freq: Once | INTRAVENOUS | Status: AC | PRN
  Administered 2019-01-30: 16:00:00 8 mL via INTRAVENOUS

## 2019-06-23 DIAGNOSIS — R002 Palpitations: Secondary | ICD-10-CM | POA: Insufficient documentation

## 2019-08-03 ENCOUNTER — Ambulatory Visit: Payer: Medicare HMO | Admitting: Gastroenterology

## 2019-08-04 ENCOUNTER — Encounter: Payer: Self-pay | Admitting: *Deleted

## 2019-08-04 ENCOUNTER — Ambulatory Visit: Payer: Medicare HMO | Admitting: Gastroenterology

## 2019-08-05 ENCOUNTER — Telehealth: Payer: Self-pay | Admitting: Gastroenterology

## 2019-08-05 NOTE — Telephone Encounter (Signed)
Patient called to reschedule appt..Stated that he couldn't find our location and apologized for missing his appt on 08/04/2019.

## 2019-09-18 ENCOUNTER — Other Ambulatory Visit: Payer: Self-pay

## 2019-09-18 ENCOUNTER — Ambulatory Visit: Payer: Medicare HMO | Admitting: Urology

## 2019-09-18 ENCOUNTER — Encounter: Payer: Self-pay | Admitting: Urology

## 2019-09-18 VITALS — BP 156/84 | HR 81 | Ht 67.0 in | Wt 175.0 lb

## 2019-09-18 DIAGNOSIS — N401 Enlarged prostate with lower urinary tract symptoms: Secondary | ICD-10-CM

## 2019-09-18 DIAGNOSIS — N5201 Erectile dysfunction due to arterial insufficiency: Secondary | ICD-10-CM | POA: Diagnosis not present

## 2019-09-18 DIAGNOSIS — R351 Nocturia: Secondary | ICD-10-CM

## 2019-09-18 DIAGNOSIS — R339 Retention of urine, unspecified: Secondary | ICD-10-CM | POA: Diagnosis not present

## 2019-09-18 LAB — BLADDER SCAN AMB NON-IMAGING: Scan Result: 86

## 2019-09-18 MED ORDER — SILDENAFIL CITRATE 20 MG PO TABS: ORAL_TABLET | ORAL | 2 refills | Status: DC

## 2019-09-18 NOTE — Progress Notes (Signed)
09/18/2019 3:02 PM   Alex Lambert 1941-01-29 [id number]  Referring provider: [name], MD [address] [facility] [place],  [place] [address]  Chief Complaint  Patient presents with   Benign Prostatic Hypertrophy    HPI: Alex Lambert is a 79 yo male seen at the request of Dr. [name] for evaluation of BPH  -Several month history of lower urinary tract symptoms consisting of nocturia x3, frequency, intermittent urinary stream and sensation of incomplete bladder emptying -Nocturia is the most bothersome symptom -Nighttime voided volumes fairly normal -Denies snoring or history of sleep apnea -Does have increased frequency in the morning which she attributes to increased fluid intake -Denies dysuria, gross hematuria -No flank, abdominal or pelvic pain -Denies prior treatment for his lower urinary tract symptoms -Intermittent mild penile discomfort    -Has a history of ED on sildenafil 40 mg and requesting refill   PMH: Past Medical History:  Diagnosis Date   Brain tumor ([place]) 2007   Optical center on the R side of the brain   Coronary artery disease    stents placed 2002   Diabetes mellitus without complication ([place])    Hypercholesterolemia    Hypertension    Hypothyroidism     Surgical History: Past Surgical History:  Procedure Laterality Date   CARDIAC CATHETERIZATION     2 stents   INCISION AND DRAINAGE     scrotum  2014   TONSILLECTOMY      Home Medications:  Allergies as of 09/18/2019   No Known Allergies     Medication List       Accurate as of September 18, 2019  3:02 PM. If you have any questions, ask your nurse or doctor.        STOP taking these medications   oxyCODONE-acetaminophen 10-325 MG tablet Commonly known as: PERCOCET Stopped by: Abbie Sons, MD     TAKE these medications   acetaminophen 500 MG tablet Commonly known as: TYLENOL Take 500 mg by mouth every 6 (six) hours as needed for mild  pain.   aspirin EC 81 MG tablet Take 81 mg by mouth daily.   esomeprazole 40 MG capsule Commonly known as: NEXIUM Take 40 mg by mouth daily as needed (reflux).   Fish Oil 1000 MG Caps Take 1,000 mg by mouth 3 (three) times daily.   ibuprofen 200 MG tablet Commonly known as: ADVIL Take 200 mg by mouth every 6 (six) hours as needed for mild pain or moderate pain.   levothyroxine 88 MCG tablet Commonly known as: SYNTHROID Take 88 mcg by mouth daily before breakfast.   metoprolol succinate 25 MG 24 hr tablet Commonly known as: TOPROL-XL Take 25 mg by mouth every morning.   multivitamin capsule Take 1 capsule by mouth daily.   simvastatin 20 MG tablet Commonly known as: ZOCOR Take 20 mg by mouth daily.   tamsulosin 0.4 MG Caps capsule Commonly known as: FLOMAX Take 1 capsule (0.4 mg total) by mouth daily after supper.       Allergies: No Known Allergies  Family History: History reviewed. No pertinent family history.  Social History:  reports that he has never smoked. He has never used smokeless tobacco. He reports that he does not drink alcohol and does not use drugs.   Physical Exam: BP (!) 156/84    Pulse 81    Ht 5\' 7"  (1.702 m)    Wt 175 lb (79.4 kg)    BMI 27.41 kg/m  Constitutional:  Alert and oriented, No acute distress. HEENT: [place] AT, moist mucus membranes.  Trachea midline, no masses. Cardiovascular: No clubbing, cyanosis, or edema. Respiratory: Normal respiratory effort, no increased work of breathing. GI: Abdomen is soft, nontender, nondistended, no abdominal masses GU: Phallus uncircumcised; prepuce retracts, glans/meatus normal.  Testes descended bilaterally without masses or tenderness.  Prostate 60 g, smooth without nodules Skin: No rashes, bruises or suspicious lesions. Neurologic: Grossly intact, no focal deficits, moving all 4 extremities. Psychiatric: Normal mood and affect.  Urinalysis: Dipstick/microscopy negative  Assessment & Plan:     1.  BPH with LUTS -Nocturia most bothersome symptom -I initially recommended a prostate-specific alpha-blocker trial however he was concerned of side effects and states he sometimes has difficulty with new medications.  He inquired about prostate supplements and we did discuss saw palmetto.  He would like to try this first and will call back if he desires Rx treatment -CT performed in 2019-prostate dimensions were measured with calculated volume 69 cc -PVR by bladder scan 86 cc -Has seen Dr. Karsten Ro at [facility] in the past and will request records for review  2. Erectile dysfunction -Rx sildenafil sent to [facility], [place] [address] [place], [place] [address] [phone]

## 2019-09-21 ENCOUNTER — Ambulatory Visit: Payer: Medicare HMO | Admitting: Gastroenterology

## 2019-09-21 LAB — MICROSCOPIC EXAMINATION: Bacteria, UA: NONE SEEN

## 2019-09-21 LAB — URINALYSIS, COMPLETE
Bilirubin, UA: NEGATIVE
Glucose, UA: NEGATIVE
Ketones, UA: NEGATIVE
Leukocytes,UA: NEGATIVE
Nitrite, UA: NEGATIVE
Protein,UA: NEGATIVE
Specific Gravity, UA: <1.005 — ABNORMAL LOW (ref 1.005–1.030)
Urobilinogen, Ur: 0.2 mg/dL (ref 0.2–1.0)
pH, UA: 5 (ref 5.0–7.5)

## 2019-09-22 ENCOUNTER — Telehealth: Payer: Self-pay | Admitting: Urology

## 2019-09-22 NOTE — Telephone Encounter (Signed)
I called the patient but he does not have voicemail could not leave a message  Sharyn Lull

## 2019-09-22 NOTE — Telephone Encounter (Signed)
-----   Message from Abbie Sons, MD sent at 09/18/2019  3:49 PM EDT ----- Regarding: Records After patient left the day it looks like he has been seen in the past by Dr. Karsten Ro at [facility].  See if he will come in at his convenience to sign a release of records.  Thanks

## 2019-09-24 ENCOUNTER — Ambulatory Visit: Payer: Medicare HMO | Admitting: Gastroenterology

## 2019-09-24 ENCOUNTER — Other Ambulatory Visit: Payer: Self-pay

## 2019-09-24 VITALS — BP 130/77 | HR 84 | Temp 98.0°F | Ht 67.0 in | Wt 179.0 lb

## 2019-09-24 DIAGNOSIS — R140 Abdominal distension (gaseous): Secondary | ICD-10-CM | POA: Diagnosis not present

## 2019-09-24 DIAGNOSIS — K219 Gastro-esophageal reflux disease without esophagitis: Secondary | ICD-10-CM | POA: Diagnosis not present

## 2019-09-24 NOTE — Patient Instructions (Signed)

## 2019-09-24 NOTE — Progress Notes (Signed)
Alex Bellows MD, MRCP(U.K) [address]  [place]  [place], [place] [address]  Main: [phone]  Fax: [phone]   Gastroenterology Consultation  Referring Provider:     [name], MD Primary Care Physician:  [name], MD Primary Gastroenterologist:  Dr. Jonathon Lambert  Reason for Consultation:     Acid reflux and bloating        HPI:   Alex Lambert is a 79 y.o. y/o male referred for consultation & management  by Dr. [name], MD.    2021: HbA1c 7.3, creatinine 1.1, LFTs normal.  March 2021 hemoglobin 15.1 g.  Iron studies normal.  Smoking symptoms here today to see me is for acid reflux and bloating.  He states that he has had issues with acid reflux for many years that was previously stable but over the last few years has gradually got worse   He describes his symptoms as heartburn which is usually worse during the early part of the day and gets better later through the day.  Usually has a light dinner but wakes up multiple episodes during the night to empty his bladder.  He also notices at the same time that he has heartburn and has to take a few Tums each night.  He has tried Protonix, Prilosec, Nexium in the past which has not worked very well.  At times he has taken his PPI after dinner.  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  He was concerned about the long-term effects of PPI in the past and hence was reluctant to take it for longer.  Zantac.  He also complains of bloating and abdominal distention.  He does consume some 0-calorie Sprite but no other artificial sugars or sweeteners.  Denies any weight loss or weight gain or dysphagia.  Past Medical History:  Diagnosis Date  . Brain tumor [facility]) 2007   Optical center on the R side of the brain  . Coronary artery disease    stents placed 2002  . Diabetes mellitus without complication ([place])   .  Hypercholesterolemia   . Hypertension   . Hypothyroidism     Past Surgical History:  Procedure Laterality Date  . CARDIAC CATHETERIZATION     2 stents  . INCISION AND DRAINAGE     scrotum  2014  . TONSILLECTOMY      Prior to Admission medications   Medication Sig Start Date End Date Taking? Authorizing Provider  acetaminophen (TYLENOL) 500 MG tablet Take 500 mg by mouth every 6 (six) hours as needed for mild pain.    [provider]  aspirin EC 81 MG tablet Take 81 mg by mouth daily.    [provider]  esomeprazole (NEXIUM) 40 MG capsule Take 40 mg by mouth daily as needed (reflux).     [provider]  ibuprofen (ADVIL,MOTRIN) 200 MG tablet Take 200 mg by mouth every 6 (six) hours as needed for mild pain or moderate pain.    [provider]  levothyroxine (SYNTHROID, LEVOTHROID) 88 MCG tablet Take 88 mcg by mouth daily before breakfast.    [provider]  metoprolol succinate (TOPROL-XL) 25 MG 24 hr tablet Take 25 mg by mouth every morning.    [provider]  Multiple Vitamin (MULTIVITAMIN) capsule Take 1 capsule by mouth daily.    [provider]  Omega-3 Fatty Acids (FISH OIL)  1000 MG CAPS Take 1,000 mg by mouth 3 (three) times daily.    [provider]  sildenafil (REVATIO) 20 MG tablet 2 tabs 1 hour prior to intercourse 09/18/19   Stoioff, Ronda Fairly, MD  simvastatin (ZOCOR) 20 MG tablet Take 20 mg by mouth daily.    [provider]    No family history on file.   Social History   Tobacco Use  . Smoking status: Never Smoker  . Smokeless tobacco: Never Used  Substance Use Topics  . Alcohol use: No  . Drug use: No    Allergies as of 09/24/2019  . (No Known Allergies)    Review of Systems:    All systems reviewed and negative except where noted in HPI.   Physical Exam:  There were no vitals taken for this visit. No LMP for male patient. Psych:  Alert and cooperative. Normal mood and  affect. General:   Alert,  Well-developed, well-nourished, pleasant and cooperative in NAD Head:  Normocephalic and atraumatic. Eyes:  Sclera clear, no icterus.   Conjunctiva pink. Ears:  Normal auditory acuity. Lungs:  Respirations even and unlabored.  Clear throughout to auscultation.   No wheezes, crackles, or rhonchi. No acute distress. Heart:  Regular rate and rhythm; no murmurs, clicks, rubs, or gallops. Neurologic:  Alert and oriented x3;  grossly normal neurologically. Psych:  Alert and cooperative. Normal mood and affect.  Imaging Studies: No results found.  Assessment and Plan:   Alex Lambert is a 79 y.o. y/o male has been referred for acid reflux as well as bloating.  I suspect that he has acid reflux for many years.  Probably getting worse recently due to the effects of diabetes and probably a degree of gastroparesis.  He also has bloating which could be related to decrease gut motility from the diabetes, gastroparesis which leads to increase in fermentation of sugars in the GI tract leading to bloating.  Previously taken PPIs on and off and at times after his meals which does not make it very effective.  We discussed in detail the mechanism of action of proton pump inhibitors, H2 receptor blockers as well as medications such as Tums.  Plan 1.  Commence on Dexilant samples have been provided advised to take daily and not as needed.  Explained that long-term use is associated with some risk such as thinning of the bones, kidney problems, increased risk of infections.  Despite this concern we need to balance the risks versus benefits.  In his case the acid reflux is affecting the quality of his life and disturbing his sleep.  I suspect that the benefits exceed the risks in his case.  I suggested that we commence him on Dexilant and see if it helps him.  In addition I suggested him to use a wedge pillow which will decrease the occurrence of nocturnal reflux and decrease daytime  symptoms significantly.  2.  In terms of his gas and bloating I suggested we give him a trial of a low FODMAP diet.  If that does not work we will consider hydrogen breath testing and treatment with Xifaxan.  Follow up in 3 to 4 weeks telephone visit  Dr Alex Bellows MD,MRCP(U.K)

## 2019-09-30 ENCOUNTER — Telehealth: Payer: Self-pay | Admitting: Urology

## 2019-09-30 ENCOUNTER — Other Ambulatory Visit: Payer: Self-pay | Admitting: *Deleted

## 2019-09-30 DIAGNOSIS — N5201 Erectile dysfunction due to arterial insufficiency: Secondary | ICD-10-CM

## 2019-09-30 MED ORDER — SILDENAFIL CITRATE 20 MG PO TABS: ORAL_TABLET | ORAL | 2 refills | Status: DC

## 2019-09-30 NOTE — Telephone Encounter (Signed)
Pt would like to have his Sildenafil sent to [facility] in [place] since it's cheaper.  Pt also needs something to help with sensitivity of his penis rubbing against his clothes, maybe a cream or something.

## 2019-09-30 NOTE — Telephone Encounter (Signed)
Left message for patient to call us back.  

## 2019-10-13 ENCOUNTER — Other Ambulatory Visit: Payer: Self-pay | Admitting: Family Medicine

## 2019-10-15 ENCOUNTER — Telehealth: Payer: Medicare HMO | Admitting: Gastroenterology

## 2019-10-20 ENCOUNTER — Telehealth: Payer: Self-pay | Admitting: Urology

## 2019-10-20 NOTE — Telephone Encounter (Signed)
He had a urinalysis at his visit last month which was normal.  The reason for the schedule follow-up was to discuss any improvement in his voiding pattern or to start medication.  He does not really need a repeat UA and can do the appointment virtually.

## 2019-10-20 NOTE — Telephone Encounter (Signed)
Pt. Wants to know if he can drop off a UCX tomorrow at 3:00 instead of coming to office appointment and then [name] can make him a telephone or Virtual visit for results. Pt. States due to Covid surge he doesn't feel safe coming to hospital and/or office. Please call pt. To advise

## 2019-10-20 NOTE — Telephone Encounter (Signed)
Patient needs to see the [name] cant just drop off a urine.

## 2019-10-21 ENCOUNTER — Other Ambulatory Visit: Payer: Self-pay

## 2019-10-21 ENCOUNTER — Telehealth (INDEPENDENT_AMBULATORY_CARE_PROVIDER_SITE_OTHER): Payer: Medicare HMO | Admitting: Urology

## 2019-10-21 DIAGNOSIS — N138 Other obstructive and reflux uropathy: Secondary | ICD-10-CM

## 2019-10-21 DIAGNOSIS — N401 Enlarged prostate with lower urinary tract symptoms: Secondary | ICD-10-CM | POA: Diagnosis not present

## 2019-10-21 DIAGNOSIS — R3129 Other microscopic hematuria: Secondary | ICD-10-CM

## 2019-10-22 ENCOUNTER — Encounter: Payer: Self-pay | Admitting: Urology

## 2019-10-22 DIAGNOSIS — N138 Other obstructive and reflux uropathy: Secondary | ICD-10-CM | POA: Insufficient documentation

## 2019-10-22 DIAGNOSIS — R3129 Other microscopic hematuria: Secondary | ICD-10-CM | POA: Insufficient documentation

## 2019-10-22 NOTE — Progress Notes (Signed)
Virtual Visit via Telephone Note  I connected with Alex Lambert on 10/22/19 at  3:00 PM EDT by telephone and verified that I am speaking with the correct person using two identifiers.  Location: Patient: Home Provider: [facility]   I discussed the limitations, risks, security and privacy concerns of performing an evaluation and management service by telephone and the availability of in person appointments. I also discussed with the patient that there may be a patient responsible charge related to this service. The patient expressed understanding and agreed to proceed.   History of Present Illness: 79 y.o. male seen last month for lower urinary tract symptoms.  I discussed starting an alpha-blocker however he wanted to start a supplement initially and has been taking saw palmetto twice daily with no definite improvement in his voiding pattern.  He did have a UA last month and Dr. Barbarann Ehlers office showing 4-10 RBCs and a repeat urinalysis this month again showed 4-10 RBCs.  No previous smoking history   Observations/Objective: Alert  Assessment and Plan:  1. BPH with lower urinary tract symptoms  No improvement on saw palmetto  He would like to continue for a longer period of time  2.  Microhematuria  AUA risk stratification: High based on age  We discussed the recommended evaluation for high risk hematuria to include CT urogram and cystoscopy  All questions were answered and he desires to schedule   Follow Up Instructions: As above   I discussed the assessment and treatment plan with the patient. The patient was provided an opportunity to ask questions and all were answered. The patient agreed with the plan and demonstrated an understanding of the instructions.   The patient was advised to call back or seek an in-person evaluation if the symptoms worsen or if the condition fails to improve as anticipated.  I provided 10 minutes of  non-face-to-face time during this encounter.   Abbie Sons, MD

## 2019-11-05 ENCOUNTER — Other Ambulatory Visit: Payer: Medicare HMO | Admitting: Urology

## 2019-11-09 ENCOUNTER — Other Ambulatory Visit
Admission: RE | Admit: 2019-11-09 | Discharge: 2019-11-09 | Disposition: A | Discharge status: Home / Self Care | Payer: Medicare HMO | Source: Home / Self Care | Attending: Urology | Admitting: Urology

## 2019-11-09 ENCOUNTER — Other Ambulatory Visit: Payer: Self-pay

## 2019-11-09 ENCOUNTER — Ambulatory Visit
Admission: RE | Admit: 2019-11-09 | Discharge: 2019-11-09 | Disposition: A | Discharge status: Home / Self Care | Payer: Medicare HMO | Source: Ambulatory Visit | Attending: Urology | Admitting: Urology

## 2019-11-09 DIAGNOSIS — R3129 Other microscopic hematuria: Secondary | ICD-10-CM | POA: Insufficient documentation

## 2019-11-09 HISTORY — DX: Unspecified asthma, uncomplicated: J45.909

## 2019-11-09 LAB — CREATININE, SERUM
Creatinine, Ser: 0.82 mg/dL (ref 0.61–1.24)
GFR calc Af Amer: >60 mL/min (ref >60)
GFR calc non Af Amer: >60 mL/min (ref >60)

## 2019-11-09 MED ORDER — IOHEXOL 300 MG/ML  SOLN
125.0000 mL | Freq: Once | INTRAMUSCULAR | Status: AC | PRN
  Administered 2019-11-09: 125 mL via INTRAVENOUS

## 2019-11-10 NOTE — Progress Notes (Signed)
   11/11/2019  CC:  Chief Complaint  Patient presents with  . Cysto    HPI: Alex Lambert is a 79 y.o. with BPH with LUTS and microhematuria returns for a cystoscopy.   -UA from 10/02/2019 showed 4-10 RBCs.  -I discussed in detail with patient on a telemedicine visit 10/21/2019 the standard recommend evaluation for high risk hematuria to include CT urogram and cystoscopy -CT hematuria work up on 11/09/2019 revealed subtle hyperenhancement of the left ureterovesicular junction. The possibility of a very small urothelial lesion in this region is not excluded. No urinary tract calculi. 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 -Subcentimeter low-attenuation lesions in the left kidney, too small to characterize, but statistically likely to represent tiny cysts. 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  -Today he stated he did not know he was going to have cystoscopy.  I again discussed the standard recommendation and the possibility of bladder tumor especially with his CT findings.  He stated he was healthy and does not have anything in his bladder however elected to proceed with cystoscopy   Blood pressure 139/89, pulse 79. NED. A&Ox3.   No respiratory distress   Abd soft, NT, ND Normal phallus with bilateral descended testicles  Cystoscopy Procedure Note  Patient identification was confirmed, informed consent was obtained, and patient was prepped using Betadine solution.  Lidocaine jelly was administered per urethral meatus.     Pre-Procedure: - Inspection reveals a normal caliber urethral meatus.  Procedure: The flexible cystoscope was introduced without difficulty - No urethral strictures/lesions are present. - Moderate lateral lobe enlargement prostate with hypervascularity - Moderate elevation bladder neck - Bilateral ureteral orifices identified - Bladder mucosa no solid or  papillary tumors, due to bladder neck elevation it was difficult to visualize the UOs however there did appear to be mucosal irregularity near the vicinity of the left UO - No bladder stones -Moderate trabeculation  Retroflexion shows moderate intravesical median lobe   Post-Procedure: - Patient tolerated the procedure well  Assessment/ Plan:  BPH with hypervascularity  Suboptimal visualization of the left UO however there did appear to be  mucosal irregularity.  I recommended scheduling cystoscopy under anesthesia with biopsy/possible TURBT.  The procedure was discussed in detail including postoperative irritative voiding symptoms.  He again stated he is healthy and does not have bladder cancer and would have to think about having this procedure done.  He indicated he would call back with his decision  He was incidentally noted on CT urogram to have a 0.6 cm hypervascular lesion in the anterior aspect of the pancreatic head.  Radiology recommended an abdominal MRI with and without contrast to evaluate for a possible small neuroendocrine neoplasm.  Will message his PCP Dr. [name] regarding this finding  I, Selena Batten, am acting as a scribe for Dr. Nicki Reaper C. Paras Kreider,  I have reviewed the above documentation for accuracy and completeness, and I agree with the above.    Abbie Sons, MD

## 2019-11-11 ENCOUNTER — Encounter: Payer: Self-pay | Admitting: Urology

## 2019-11-11 ENCOUNTER — Ambulatory Visit: Payer: Medicare HMO | Admitting: Urology

## 2019-11-11 ENCOUNTER — Other Ambulatory Visit: Payer: Self-pay

## 2019-11-11 VITALS — BP 139/89 | HR 79

## 2019-11-11 DIAGNOSIS — N401 Enlarged prostate with lower urinary tract symptoms: Secondary | ICD-10-CM | POA: Diagnosis not present

## 2019-11-11 DIAGNOSIS — N138 Other obstructive and reflux uropathy: Secondary | ICD-10-CM

## 2019-11-11 LAB — URINALYSIS, COMPLETE
Bilirubin, UA: NEGATIVE
Glucose, UA: NEGATIVE
Ketones, UA: NEGATIVE
Nitrite, UA: NEGATIVE
Protein,UA: NEGATIVE
Specific Gravity, UA: 1.025 (ref 1.005–1.030)
Urobilinogen, Ur: 0.2 mg/dL (ref 0.2–1.0)
pH, UA: 5.5 (ref 5.0–7.5)

## 2019-11-11 LAB — MICROSCOPIC EXAMINATION
Bacteria, UA: NONE SEEN
Epithelial Cells (non renal): NONE SEEN /hpf (ref 0–10)

## 2019-11-12 ENCOUNTER — Ambulatory Visit: Payer: Medicare HMO | Admitting: Dietician

## 2019-11-12 ENCOUNTER — Telehealth: Payer: Self-pay

## 2019-11-12 NOTE — Telephone Encounter (Signed)
Called patient to follow up on after hours nurse line. Patient had severe burning post cysto in office yesterday. Patient was told to try tylenol by after hours line. He states this did help his pain but he is still having pain this morning. Patient is very upset that he was not given proper description of procedure and possible effects after. He states [name] did not disclose this prior to the procedure nor was the consent explained prior the day of. Patient states he would not have had the procedure done had he known of the possibility of burning after. He is very upset by this. It was explained that most tolerate the procedure in office very well and it is possible to still have some discomfort after and to continue to utilize tylenol as needed and he may also try AZO OTC to help with pain as well. Patient verbalized understanding. He also is upset that his CTscan report was not given to him at his visit. It was explained that the results are discussed in detail at the appointment and a copy can be given upon request. This was printed and left upfront for him to pick up at his convenience. He was also encourage to complete his mychart activation, so that he would have access to his results this way as well.

## 2019-11-15 ENCOUNTER — Encounter: Payer: Self-pay | Admitting: Urology

## 2019-11-16 ENCOUNTER — Other Ambulatory Visit: Payer: Self-pay

## 2019-11-16 ENCOUNTER — Other Ambulatory Visit: Payer: Self-pay | Admitting: Urology

## 2019-11-16 ENCOUNTER — Encounter: Payer: Self-pay | Admitting: Urology

## 2019-11-16 ENCOUNTER — Telehealth: Payer: Self-pay | Admitting: *Deleted

## 2019-11-16 ENCOUNTER — Telehealth (INDEPENDENT_AMBULATORY_CARE_PROVIDER_SITE_OTHER): Payer: Medicare HMO | Admitting: Urology

## 2019-11-16 DIAGNOSIS — R3129 Other microscopic hematuria: Secondary | ICD-10-CM | POA: Diagnosis not present

## 2019-11-16 DIAGNOSIS — R300 Dysuria: Secondary | ICD-10-CM

## 2019-11-16 MED ORDER — CIPROFLOXACIN HCL 250 MG PO TABS
250.0000 mg | ORAL_TABLET | Freq: Two times a day (BID) | ORAL | 0 refills | Status: AC
Start: 2019-11-16 — End: 2019-11-21

## 2019-11-16 NOTE — Progress Notes (Signed)
Virtual Visit via Telephone Note  I connected with Alex Lambert on 11/16/19 at  2:30 PM EDT by telephone and verified that I am speaking with the correct person using two identifiers.  Location: Patient: Home Provider: [facility]    I discussed the limitations, risks, security and privacy concerns of performing an evaluation and management service by telephone and the availability of in person appointments. I also discussed with the patient that there may be a patient responsible charge related to this service. The patient expressed understanding and agreed to proceed.   History of Present Illness: 79 y.o. male recently underwent CT urogram and cystoscopy for microscopic hematuria.  CTU showed subtotal hyperenhancement of the region of the left UVJ and cystoscopy visualization was suboptimal due to BPH and there appeared to be some mucosal irregularity in this region  I have recommended cystoscopy under anesthesia with bladder biopsy versus TURBT.  He was unsure if he wanted to proceed and would like to discuss further today.  He wanted to know if a definite lesion was present and I informed him it was only a possibility.  He also states he has still not completely recovered from the cystoscopy.  He still complains of mild dysuria.   Observations/Objective: Alert, answers questions appropriately  Assessment and Plan:  We discussed the CT findings in the region of the left UVJ were not adequately identified on cystoscopy and it is possible urothelial carcinoma could be present that would be missed without further evaluation  The option of a follow-up CT in 6 months to assess for any interval change was discussed.  He asked if it would be unwise to not proceed with cystoscopy/biopsy of this area and although I recommended he have the procedure performed the likelihood of aggressive urothelial carcinoma is very low.  He has elected to hold off on the procedure and will  schedule a follow-up CT in 6 months  I would also recommend a urine cytology which I did not discuss with him during this visit and he will be contacted to schedule  Follow Up Instructions:  CT pelvis with contrast 6 months   He is having persistent dysuria and will send in Cipro 250 mg twice daily x5 days  I discussed the assessment and treatment plan with the patient. The patient was provided an opportunity to ask questions and all were answered. The patient agreed with the plan and demonstrated an understanding of the instructions.   The patient was advised to call back or seek an in-person evaluation if the symptoms worsen or if the condition fails to improve as anticipated.  I provided 24 minutes of non-face-to-face time during this encounter.   Abbie Sons, MD

## 2019-11-16 NOTE — Telephone Encounter (Signed)
urine cytology  Scheduled 11/19/2019

## 2019-11-17 ENCOUNTER — Other Ambulatory Visit: Payer: Self-pay | Admitting: Family Medicine

## 2019-11-17 ENCOUNTER — Telehealth (INDEPENDENT_AMBULATORY_CARE_PROVIDER_SITE_OTHER): Payer: Medicare HMO | Admitting: Gastroenterology

## 2019-11-17 DIAGNOSIS — K869 Disease of pancreas, unspecified: Secondary | ICD-10-CM

## 2019-11-17 DIAGNOSIS — K219 Gastro-esophageal reflux disease without esophagitis: Secondary | ICD-10-CM | POA: Diagnosis not present

## 2019-11-17 DIAGNOSIS — R9389 Abnormal findings on diagnostic imaging of other specified body structures: Secondary | ICD-10-CM

## 2019-11-17 NOTE — Progress Notes (Signed)
Alex Lambert , MD [address]  [place]  [place], [place] [address]  Main: [phone]  Fax: [phone]   Primary Care Physician: [name], MD  Virtual Visit via Telephone Note  I connected with patient on 11/17/19 at  3:40 PM EDT by telephone and verified that I am speaking with the correct person using two identifiers.   I discussed the limitations, risks, security and privacy concerns of performing an evaluation and management service by telephone and the availability of in person appointments. I also discussed with the patient that there may be a patient responsible charge related to this service. The patient expressed understanding and agreed to proceed.  Location of Patient: Home Location of Provider: Home Persons involved: Patient and provider only   History of Present Illness: Chief Complaint  Patient presents with  . Follow-up    HPI: Alex Lambert is a 79 y.o. male   Summary of history : Initially referred and seen on 09/24/2019 for acid reflux and bloating.He states that he has had issues with acid reflux for many years that was previously stable but over the last few years has gradually got worse   He describes his symptoms as heartburn which is usually worse during the early part of the day and gets better later through the day.  Usually has a light dinner but wakes up multiple episodes during the night to empty his bladder.  He also notices at the same time that he has heartburn and has to take a few Tums each night.  He has tried Protonix, Prilosec, Nexium in the past which has not worked very well.  At times he has taken his PPI after dinner.  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  He was concerned about the long-term effects of PPI in the past and hence was reluctant to take it for longer.    He also complains of bloating and abdominal  distention.  He does consume some 0-calorie Sprite but no other artificial sugars or sweeteners.  Denies any weight loss or weight gain or dysphagia.   Interval history 09/24/2019-11/17/2019  Since his last visit he enforce lifestyle changes and started using a wedge pillow and he did not need to stop the Dexilant as he states that the over-the-counter Nexium 20 mg provided him satisfactory relief from acid reflux.  Current Outpatient Medications  Medication Sig Dispense Refill  . acetaminophen (TYLENOL) 500 MG tablet Take 500 mg by mouth every 6 (six) hours as needed for mild pain.     Marland Kitchen aspirin EC 81 MG tablet Take 81 mg by mouth daily.    . ciprofloxacin (CIPRO) 250 MG tablet Take 1 tablet (250 mg total) by mouth every 12 (twelve) hours for 5 days. 10 tablet 0  . esomeprazole (NEXIUM) 40 MG capsule Take 40 mg by mouth daily as needed (reflux).     . famotidine (PEPCID) 10 MG tablet Take by mouth.    Marland Kitchen glipiZIDE (GLUCOTROL XL) 2.5 MG 24 hr tablet Take 2.5 mg by mouth daily.    Marland Kitchen levothyroxine (SYNTHROID, LEVOTHROID) 88 MCG tablet Take 88 mcg by mouth daily before breakfast.    . Multiple Vitamin (MULTIVITAMIN) capsule Take 1 capsule by mouth daily.    . Omega-3 Fatty Acids (FISH OIL) 1000 MG CAPS Take 1,000 mg by mouth 3 (three) times daily.    Marland Kitchen  sildenafil (REVATIO) 20 MG tablet 2 tabs 1 hour prior to intercourse 30 tablet 2  . simvastatin (ZOCOR) 20 MG tablet Take 20 mg by mouth daily.     No current facility-administered medications for this visit.    Allergies as of 11/17/2019  . (No Known Allergies)    Review of Systems:    All systems reviewed and negative except where noted in HPI.   Observations/Objective:  Labs: CMP     Component Value Date/Time   CREATININE 0.82 11/09/2019 1340   CREATININE 0.99 05/27/2012 0947   GFRNONAA >60 11/09/2019 1340   GFRNONAA >60 05/27/2012 0947   GFRAA >60 11/09/2019 1340   GFRAA >60 05/27/2012 0947   No results found for: WBC, HGB,  HCT, MCV, PLT  Imaging Studies: CT HEMATURIA WORKUP  Result Date: 11/09/2019 CLINICAL DATA:  79 year old male with history of microscopic hematuria. Prior history of kidney stones requiring lithotripsy 5 years ago. EXAM: CT ABDOMEN AND PELVIS WITHOUT AND WITH CONTRAST TECHNIQUE: Multidetector CT imaging of the abdomen and pelvis was performed following the standard protocol before and following the bolus administration of intravenous contrast. CONTRAST:  167mL OMNIPAQUE IOHEXOL 300 MG/ML  SOLN COMPARISON:  CT the abdomen and pelvis 09/16/2017. FINDINGS: Lower chest: 2 mm right lower lobe pulmonary nodule (axial image 12 of series 5), stable compared to prior study from 2019, considered definitively benign. Atherosclerotic calcifications in the descending thoracic aorta as well as the left circumflex and right coronary arteries. Hepatobiliary: No suspicious cystic or solid hepatic lesions. No intra or extrahepatic biliary ductal dilatation. Gallbladder is normal in appearance. Pancreas: Small hypervascular lesion in the anterior aspect of the head of the pancreas (axial image 22 of series 7 and coronal image 41 of series 10) measuring 0.6 x 0.5 x 0.5 cm. No other pancreatic mass. No pancreatic ductal dilatation. No pancreatic or peripancreatic fluid collections or inflammatory changes. Spleen: Unremarkable. Adrenals/Urinary Tract: No calcifications are identified within the collecting system of either kidney, along the course of either ureter, or within the lumen of the urinary bladder. There is no hydroureteronephrosis. In the interpolar region of the right kidney (axial image 30 of series 7) there is a partially exophytic 1.6 x 1.1 cm lesion which is intermediate attenuation (46 HU), but does not enhance, compatible with a proteinaceous/hemorrhagic cyst. There is a dependent calcification within this lesion which is mobile during prone imaging. Several subcentimeter low-attenuation lesions are noted throughout  the left kidney, too small to characterize, but statistically likely to represent tiny cysts. On postcontrast delayed imaging there are no definite filling defects within the collecting system of either kidney, along the course of either ureter, or within the lumen of the urinary bladder. However, there is very subtle enhancement of the left ureterovesicular junction best appreciated on the coronal image 69 of series 10. Urinary bladder appears mildly thickened and trabeculated, without definite focal lesion. Bilateral adrenal glands are normal in appearance. Stomach/Bowel: Normal appearance of the stomach. No pathologic dilatation of small bowel or colon. Normal appendix. Vascular/Lymphatic: Aortic atherosclerosis, without evidence of aneurysm or dissection in the abdominal or pelvic vasculature. No lymphadenopathy noted in the abdomen or pelvis. Reproductive: Median lobe hypertrophy in the prostate gland. Seminal vesicles are unremarkable in appearance. Other: No significant volume of ascites.  No pneumoperitoneum. Musculoskeletal: There are no aggressive appearing lytic or blastic lesions noted in the visualized portions of the skeleton. IMPRESSION: 1. Subtle hyperenhancement of the left ureterovesicular junction. The possibility of a very small urothelial lesion  in this region is not excluded. Correlation with cystoscopy is recommended. 2. No urinary tract calculi. 3. 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 4. Subcentimeter low-attenuation lesions in the left kidney, too small to characterize, but statistically likely to represent tiny cysts. 5. 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 The possibility of a small neuroendocrine neoplasm warrants consideration, and further evaluation with nonemergent abdominal MRI with and without IV gadolinium is recommended in the near future to better evaluate this finding. 6. Aortic  atherosclerosis, in addition to at least 2 vessel coronary artery disease. Assessment for potential risk factor modification, dietary therapy or pharmacologic therapy may be warranted, if clinically indicated. Electronically Signed   By: Vinnie Langton M.D.   On: 11/09/2019 17:00    Assessment and Plan:   Wilbert K Moncus is a 79 y.o. y/o male here to follow-up for acid reflux as well as bloating.   Previously taken PPIs on and off and at times after his meals which does not make it very effective.  We discussed in detail the mechanism of action of proton pump inhibitors, H2 receptor blockers as well as medications such as Tums.  Plan 1.    After his last visit he commenced on Nexium 20 mg a day but he started using the acid reflux below which has controlled on his acid reflux symptoms he did not need to stop the Dexilant.  I advised him things he has not responded to H2 receptor blockers in the past then the next best alternative would be to control his acid reflux symptoms with the lowest dose of a proton pump inhibitor which would be Nexium 20 mg a day.  We discussed the balance between risks and benefits of long-term PPI use and in his case the benefit exceeds the risk  Follow-up as needed    I discussed the assessment and treatment plan with the patient. The patient was provided an opportunity to ask questions and all were answered. The patient agreed with the plan and demonstrated an understanding of the instructions.   The patient was advised to call back or seek an in-person evaluation if the symptoms worsen or if the condition fails to improve as anticipated.  I provided 15 minutes of non-face-to-face time during this encounter.  Dr Alex Bellows MD,MRCP [facility]) Gastroenterology/Hepatology Pager: [phone]   Speech recognition software was used to dictate this note.

## 2019-11-19 ENCOUNTER — Other Ambulatory Visit: Payer: Self-pay

## 2019-11-19 ENCOUNTER — Encounter: Payer: Self-pay | Admitting: Urology

## 2019-11-19 ENCOUNTER — Other Ambulatory Visit: Payer: Self-pay | Admitting: Family Medicine

## 2019-11-19 DIAGNOSIS — R3129 Other microscopic hematuria: Secondary | ICD-10-CM

## 2019-11-23 ENCOUNTER — Other Ambulatory Visit: Payer: Medicare HMO

## 2019-11-23 ENCOUNTER — Encounter: Payer: Self-pay | Admitting: Urology

## 2019-11-26 ENCOUNTER — Ambulatory Visit: Payer: Medicare HMO | Admitting: Dietician

## 2019-12-08 ENCOUNTER — Other Ambulatory Visit: Payer: Medicare HMO

## 2019-12-10 ENCOUNTER — Ambulatory Visit: Payer: Medicare HMO | Admitting: Physician Assistant

## 2019-12-10 ENCOUNTER — Encounter: Payer: Self-pay | Admitting: Physician Assistant

## 2019-12-10 ENCOUNTER — Other Ambulatory Visit: Payer: Self-pay

## 2019-12-10 ENCOUNTER — Telehealth: Payer: Self-pay | Admitting: Physician Assistant

## 2019-12-10 VITALS — BP 155/82 | HR 99 | Ht 67.0 in | Wt 180.0 lb

## 2019-12-10 DIAGNOSIS — R300 Dysuria: Secondary | ICD-10-CM | POA: Diagnosis not present

## 2019-12-10 DIAGNOSIS — N4889 Other specified disorders of penis: Secondary | ICD-10-CM

## 2019-12-10 MED ORDER — CIPROFLOXACIN HCL 250 MG PO TABS: 250.0000 mg | ORAL_TABLET | Freq: Two times a day (BID) | ORAL | 0 refills | Status: DC

## 2019-12-10 MED ORDER — SULFAMETHOXAZOLE-TRIMETHOPRIM 800-160 MG PO TABS: 1.0000 | ORAL_TABLET | Freq: Two times a day (BID) | ORAL | 0 refills | Status: DC

## 2019-12-10 MED ORDER — SULFAMETHOXAZOLE-TRIMETHOPRIM 800-160 MG PO TABS: 1.0000 | ORAL_TABLET | Freq: Two times a day (BID) | ORAL | 0 refills | Status: AC

## 2019-12-10 NOTE — Progress Notes (Signed)
12/10/2019 5:02 PM   Alex Lambert [birth year] [id number]  CC: Chief Complaint  Patient presents with  . Dysuria    HPI: Alex Lambert is a 79 y.o. 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  He previously reported dysuria the day following cystoscopy with [name] on 11/11/2019.  He was prescribed empiric Cipro 250 mg twice daily x5 days for possible UTI.    Today he reports not taking Cipro.  He states that his dysuria slightly improved on its own but never resolved, however today it has been noted to be more frequent with urination.  He reports a history of GI side effects, namely diarrhea, with antibiotics and is concerned about taking them. He believes he has tolerated Cipro well in the past.  Additionally, he reports he has not been able to achieve erection since cystoscopy.  He states he is also having penile aching and a.m. frequency.  He reports that he would like to switch providers and see Dr. Erlene Quan moving forward.  In-office UA today positive for trace glucose, 2+ blood, 1+ protein, and 1+ leukocyte esterase; urine microscopy with >30 WBCs/HPF and >30 RBCs/HPF.  PMH: Past Medical History:  Diagnosis Date  . Asthma   . Brain tumor [facility]) 2007   Optical center on the R side of the brain  . Coronary artery disease    stents placed 2002  . Diabetes mellitus without complication ([place])   . Hypercholesterolemia   . Hypertension   . Hypothyroidism     Surgical History: Past Surgical History:  Procedure Laterality Date  . CARDIAC CATHETERIZATION     2 stents  . INCISION AND DRAINAGE     scrotum  2014  . TONSILLECTOMY      Home Medications:  Allergies as of 12/10/2019   No Known Allergies       Medication List       Accurate as of December 10, 2019  5:02 PM. If you have any questions, ask your nurse or doctor.        acetaminophen 500 MG tablet Commonly known as: TYLENOL Take 500 mg by mouth every 6 (six) hours as needed for mild pain.   aspirin EC 81 MG tablet Take 81 mg by mouth daily.   esomeprazole 40 MG capsule Commonly known as: NEXIUM Take 40 mg by mouth daily as needed (reflux).   famotidine 10 MG tablet Commonly known as: PEPCID Take by mouth.   Fish Oil 1000 MG Caps Take 1,000 mg by mouth 3 (three) times daily.   fluticasone 50 MCG/ACT nasal spray Commonly known as: FLONASE Place 2 sprays into both nostrils daily.   glipiZIDE 2.5 MG 24 hr tablet Commonly known as: GLUCOTROL XL Take 2.5 mg by mouth daily.   ibuprofen 200 MG tablet Commonly known as: ADVIL Take by mouth.   levothyroxine 88 MCG tablet Commonly known as: SYNTHROID Take 88 mcg by mouth daily before breakfast.   metoprolol succinate 25 MG 24 hr tablet Commonly known as: TOPROL-XL Take 1 tablet by mouth daily.   multivitamin capsule Take 1 capsule by mouth daily.   RABEprazole 20 MG tablet Commonly known as: ACIPHEX Take by mouth.   sildenafil 20 MG tablet Commonly known as: REVATIO 2 tabs 1 hour prior  to intercourse   simvastatin 20 MG tablet Commonly known as: ZOCOR Take 20 mg by mouth daily.   sucralfate 1 g tablet Commonly known as: CARAFATE Take by mouth.   sulfamethoxazole-trimethoprim 800-160 MG tablet Commonly known as: BACTRIM DS Take 1 tablet by mouth 2 (two) times daily for 7 days. Started by: [name], PA-C   Symbicort 160-4.5 MCG/ACT inhaler Generic drug: budesonide-formoterol SMARTSIG:2 Inhalation Via Inhaler Twice Daily       Allergies:  No Known Allergies  Family History: No family history on file.  Social History:   reports that he has never smoked. He has never used smokeless tobacco. He reports that he does not drink  alcohol and does not use drugs.  Physical Exam: BP (!) 155/82 (BP Location: Left Arm, Patient Position: Sitting, Cuff Size: Normal)   Pulse 99   Ht 5\' 7"  (1.702 m)   Wt 180 lb (81.6 kg)   BMI 28.19 kg/m   Constitutional:  Alert and oriented, no acute distress, nontoxic appearing HEENT: [place], AT Cardiovascular: No clubbing, cyanosis, or edema Respiratory: Normal respiratory effort, no increased work of breathing Skin: No rashes, bruises or suspicious lesions Neurologic: Grossly intact, no focal deficits, moving all 4 extremities Psychiatric: Normal mood and affect  Laboratory Data: Results for orders placed or performed in visit on 12/10/19  Microscopic Examination   Urine  Result Value Ref Range   WBC, UA >30 (A) 0 - 5 /hpf   RBC >30 (A) 0 - 2 /hpf   Epithelial Cells (non renal) 0-10 0 - 10 /hpf   Bacteria, UA Few None seen/Few  Urinalysis, Complete  Result Value Ref Range   Specific Gravity, UA 1.020 1.005 - 1.030   pH, UA 5.5 5.0 - 7.5   Color, UA Yellow Yellow   Appearance Ur Cloudy (A) Clear   Leukocytes,UA 1+ (A) Negative   Protein,UA 1+ (A) Negative/Trace   Glucose, UA Trace (A) Negative   Ketones, UA Negative Negative   RBC, UA 2+ (A) Negative   Bilirubin, UA Negative Negative   Urobilinogen, Ur 0.2 0.2 - 1.0 mg/dL   Nitrite, UA Negative Negative   Microscopic Examination See below:    Assessment & Plan:   1. Dysuria Dysuria following cystoscopy, patient deferred previously prescribed Cipro.  UA today notable for pyuria and microscopic hematuria, consistent with possible acute cystitis.  Will start empiric antibiotics and send urine for culture for further evaluation.  I had a lengthy conversation with the patient today regarding antibiotic selection.  I explained that my first choice in this setting would be Bactrim DS twice daily x7 days, however patient was hesitant to start this medication due to concerns for possible GI upset.  I counseled him on probiotic use  to minimize these.  Given patient's report of tolerating Cipro well in the past, I offered him this medication as an alternative, however I explained that this medication does carry an increased risk of tendon rupture.  Patient was very concerned about the risk for tendon rupture, however ultimately he elected to proceed with Cipro.  Prescription provided today. - Urinalysis, Complete - CULTURE, URINE COMPREHENSIVE - Ciprofloxacin 250 mg twice daily x7 days  2. Penile pain Penile aching and erectile dysfunction following cystoscopy concerning for possible inflammatory prostatitis.  I explained that first-line treatment for this condition includes Flomax, which he has been resistant to try in the past.  Patient explained that the symptoms were not terribly bothersome for him and he would prefer to  defer therapy.  Return in about 2 months (around 02/09/2020) for Symptom recheck with Dr. Erlene Quan.   I spent 45 minutes on the day of the encounter to include pre-visit record review, face-to-face time with the patient, and post-visit ordering of tests.   [name], PA-C  [facility] [address] [place], [place] [address] [phone]

## 2019-12-10 NOTE — Telephone Encounter (Signed)
Bactrim sent. Please notify patient.

## 2019-12-10 NOTE — Telephone Encounter (Signed)
Pt LMOM about NOT having Cipro filled and would like you to call in the other RX to [facility] on [address].  He would like a return call, today if possible [phone]

## 2019-12-11 LAB — MICROSCOPIC EXAMINATION
RBC, Urine: >30 /hpf — AB (ref 0–2)
WBC, UA: >30 /hpf — AB (ref 0–5)

## 2019-12-11 LAB — URINALYSIS, COMPLETE
Bilirubin, UA: NEGATIVE
Ketones, UA: NEGATIVE
Nitrite, UA: NEGATIVE
Specific Gravity, UA: 1.02 (ref 1.005–1.030)
Urobilinogen, Ur: 0.2 mg/dL (ref 0.2–1.0)
pH, UA: 5.5 (ref 5.0–7.5)

## 2019-12-11 NOTE — Telephone Encounter (Signed)
Notified patient as advised. Patient would like to know besides GI upset, are there any other possible side effects of the Bactrim? Please advise.

## 2019-12-11 NOTE — Telephone Encounter (Signed)
Patient information sheet on Bactrim sent to the patient via [place].

## 2019-12-14 LAB — CULTURE, URINE COMPREHENSIVE

## 2019-12-16 ENCOUNTER — Ambulatory Visit: Payer: Medicare HMO

## 2019-12-16 ENCOUNTER — Telehealth: Payer: Self-pay

## 2019-12-16 NOTE — Telephone Encounter (Signed)
Patient called the office today wanting to let us know that his arms are sore with taking the Cipro and wanted to know his culture results. Patient was notified that results are positive Cipro will work to treat and should continue the course. He was instructed that if soreness worsens he should stop and should avoid heavy lifting or working out while on Cipro due to possible side effect of tendon rupture. Patient states he stopped all of his other medication while taking Cipro. He was instructed to resume his regular medications. Patient would also like to speak to management about his care he has received.

## 2019-12-17 ENCOUNTER — Other Ambulatory Visit: Payer: Self-pay

## 2019-12-17 ENCOUNTER — Encounter: Payer: Self-pay | Admitting: Dietician

## 2019-12-17 ENCOUNTER — Encounter: Payer: Medicare HMO | Attending: Family Medicine | Admitting: Dietician

## 2019-12-17 VITALS — Ht 67.0 in | Wt 178.5 lb

## 2019-12-17 DIAGNOSIS — E119 Type 2 diabetes mellitus without complications: Secondary | ICD-10-CM | POA: Diagnosis not present

## 2019-12-17 NOTE — Telephone Encounter (Signed)
Patient called and states he is still going to continue Cipro even though he has some mild discomfort in his shoulder. He states he was given a 5 day course and a 7 day course of the ABX and wanted to be sure that because he is still having mild dysuria he is going to take the medication for 7 days. I informed him to go ahead and finish the abx and to be cautious of the side effect of tendon tightness. He states he is aware of the side effect and will be very carefull. He states he will contact our office next week if after he finishes the ABX he is not completely rid of the burning.

## 2019-12-17 NOTE — Progress Notes (Signed)
Medical Nutrition Therapy: Visit start time: 1700  end time: 1600  Assessment:  Diagnosis: type 2 diabetes Past medical history: GERD Psychosocial issues/ stress concerns: pt rates stress level as "low" and feels "very well" about stress management   Preferred learning method:  . Reading/visual   Current weight: 178.5 lbs  Height: 5'7" Medications, supplements: reconciled in medical record   Progress and evaluation:   Pt reports checking fasting FBGs several times a week- 130s-150s; A1c- 7.3 on 09/02/2019  Pt states he has had diabetes for 5+ years    Pt states his goals are to get his A1c back to 7.0 or below and to not have to increase dose of glipizide   Physical activity: 45 min walking and yoga, 4 days/week   Dietary Intake:  Usual eating pattern includes 3 meals and 1-2 snacks per day. Dining out frequency: 2+ meals per week.  Breakfast: 20 whole raw almonds/lightly salted and roasted cashews or pistachios and shredded wheat biscuit with lactose free skim milk and whole apple or 1/2 banana or orange; 2-3 slices dave killer bread with fat free cheese or 2 slices of colby jack cheese Lunch: 2 grilled chicken breast/fish with veggies; 2 [employer] grilled chicken sandwich; 4 slices extra cheese pizza from [employer]  Snack: cheez puffs, 20 peanuts in shells  Supper: chicken and vegetables(broccoli, green beans, cauliflower zucchini); 3-4x/week white rice  Snack: 1/2 snickers (2-3x/week); 1/4 cup cottage with 6 grapes (3-4x/week), 1/2 cup  Beverages: water, sparkling water, diet sprite    Nutrition Care Education: Basic nutrition: basic food groups, appropriate nutrient balance, appropriate meal and snack schedule, general nutrition guidelines    Weight control: identifying healthy weight, determining reasonable weight loss rate, importance of low sugar and low fat choices, portion control strategies  Diabetes:  goals for BGs, appropriate meal and snack schedule, appropriate carb  intake and balance, healthy carb choices, role of fiber, protein, fat Other lifestyle changes:  benefits of making changes, identifying habits that need to change  Nutritional Diagnosis:  NB-1.6 Limited adherence to nutrition-related recommendations As related to type 2 diabetes.  As evidenced by pt diet recall, pt attitude towards making changes.  Intervention:  Discussion and instruction as noted above.  Discussed importance of moderation in portion size and frequency of high calorie and low nutrient density foods.  Pt states he 'is doing all the right things and does not understand why his A1c is not lower'.  Discussed trying farmer's market for more fresh produce d/t pt stating food in the grocery store is not fresh like it is overseas.  Established goals for additional change.    Increase fruit and vegetable intake   Incorporate more F/V at snacks and lunch when dining out   Increase fiber intake   Eat at least 3 servings of whole grains a day  Increase fruit and vegetable intake  Limit sodium intake   Reduce sodium intake to recommended 1500mg /day   Read nutrition labels on packaged foods to monitor sodium intake   400-600 mg/meal  <200 mg/snack   Decrease fast food frequency   Avoid processed meats   Portion control carb intake  3-4 servings of carbs at meals   1-2 servings of carbs at snacks   Education Materials given:  . General diet guidelines for Diabetes . MyPlate handout  . Novo nordisk booklet- Meal planning and carb counting  . Plate Planner with food lists  . Goals/ instructions  Learner/ who was taught:  . Patient  .  Spouse/ partner  Level of understanding: Marland Kitchen Verbalizes/ demonstrates competency  Demonstrated degree of understanding via:   Teach back Learning barriers: . None  Willingness to learn/ readiness for change: . Hesitance, contemplating change- pt states he does not think the goals identified in this visit will help him meet his A1c  goal but might try some of them  Monitoring and Evaluation:  Dietary intake, exercise, BGs and A1c, and body weight      follow up: prn

## 2019-12-17 NOTE — Patient Instructions (Addendum)
   Try going to the farmer's market   If you want to enjoy pizza, try having 1-2 slices of pizza   Try to have some carbs at meals to help with snacking (cheez puffs, 1/2 snickers)   Have more mindful snacks   Carb + protein   Try testing blood sugar 2-hours after meals   Goal is <180

## 2019-12-25 ENCOUNTER — Telehealth: Payer: Self-pay | Admitting: Dietician

## 2019-12-25 NOTE — Telephone Encounter (Signed)
Spoke with patient about potential causes of elevated fasting blood sugars.  Patient agreed to keep detailed food record for three days along with blood sugar record to determine if there is a pattern in his eating habits that is effecting his FBGs.  Also, discussed adjusting the quantity and frequency of certain foods may help with managing blood sugars.

## 2019-12-25 NOTE — Telephone Encounter (Signed)
Spoke to the patient and heard his concerns.

## 2020-01-04 ENCOUNTER — Telehealth: Payer: Self-pay | Admitting: Dietician

## 2020-01-04 NOTE — Telephone Encounter (Signed)
Pt called and left message on 10/8 about discussing the food and BG record he has kept.  Called pt back today and spoke with his wife about setting up a follow up appt to review food and BG record.  Wife of pt stated she will relay the message to him.

## 2020-01-05 ENCOUNTER — Other Ambulatory Visit: Payer: Self-pay | Admitting: Neurosurgery

## 2020-01-05 DIAGNOSIS — D329 Benign neoplasm of meninges, unspecified: Secondary | ICD-10-CM

## 2020-01-14 ENCOUNTER — Telehealth: Payer: Self-pay

## 2020-01-14 NOTE — Telephone Encounter (Signed)
Patient called and left a voicemail stating that he would like to hear back from you in regards to the concerns he had via your last conversation.

## 2020-01-22 ENCOUNTER — Other Ambulatory Visit: Payer: Self-pay

## 2020-01-22 ENCOUNTER — Ambulatory Visit
Admission: RE | Admit: 2020-01-22 | Discharge: 2020-01-22 | Disposition: A | Discharge status: Home / Self Care | Payer: Medicare HMO | Source: Ambulatory Visit | Attending: Family Medicine | Admitting: Family Medicine

## 2020-01-22 DIAGNOSIS — R9389 Abnormal findings on diagnostic imaging of other specified body structures: Secondary | ICD-10-CM | POA: Diagnosis present

## 2020-01-22 DIAGNOSIS — K869 Disease of pancreas, unspecified: Secondary | ICD-10-CM | POA: Insufficient documentation

## 2020-01-22 MED ORDER — GADOBUTROL 1 MMOL/ML IV SOLN
8.0000 mL | Freq: Once | INTRAVENOUS | Status: AC | PRN
  Administered 2020-01-22: 8 mL via INTRAVENOUS

## 2020-01-27 ENCOUNTER — Ambulatory Visit
Admission: RE | Admit: 2020-01-27 | Discharge: 2020-01-27 | Disposition: A | Discharge status: Home / Self Care | Payer: Medicare HMO | Source: Ambulatory Visit | Attending: Neurosurgery | Admitting: Neurosurgery

## 2020-01-27 ENCOUNTER — Ambulatory Visit: Payer: Medicare HMO

## 2020-01-27 ENCOUNTER — Other Ambulatory Visit: Payer: Self-pay

## 2020-01-27 DIAGNOSIS — D329 Benign neoplasm of meninges, unspecified: Secondary | ICD-10-CM | POA: Diagnosis not present

## 2020-01-27 MED ORDER — GADOBUTROL 1 MMOL/ML IV SOLN
7.5000 mL | Freq: Once | INTRAVENOUS | Status: AC | PRN
  Administered 2020-01-27: 7.5 mL via INTRAVENOUS

## 2020-02-09 ENCOUNTER — Ambulatory Visit: Payer: Self-pay | Admitting: Urology

## 2020-02-09 NOTE — Progress Notes (Deleted)
02/09/2020 8:22 AM   Alex Lambert 15-Sep-1940 [id number]  Referring provider: [name], MD [address] [facility] [place],  [place] [address]  No chief complaint on file.   HPI: 79 year old male previously patient of [name], also seen by [name] who presents today for follow-up.  He is requesting a new provider and is new to me today.  Most recently, he was seen and evaluated by [name] in 09/18/2019.  At that point time, he was complaining of urinary symptoms, primarily nocturia urgency and frequency.  He tried saw palmetto.  He also was noted to have incidental microscopic hematuria.  He underwent CT urogram on 11/09/2019 which showed some subtle hyperenhancement of the left UVJ.  Cystoscopy revealed a moderate median lobe and visualization of the UVJ was suboptimal due to his anatomy.  Cystoscopy in the operating room, possible biopsy versus TURBT was discussed.  The patient declined.  Plans were made for CT scan in 6 months as well as urine cytology.  Following cystoscopy, patient complained of dysuria.  He was prescribed empiric Cipro.  He did not take this medication due to concern for side effects.  He was seen several weeks later in clinic which time his urine was grossly positive.  He ended up growing Enterococcus.  He was treated with Bactrim because he refused Cipro.  He also complained that he developed new onset acute erectile dysfunction following cystoscopy.  He has a remote history of nephrolithiasis previously requiring surgical intervention.   PMH: Past Medical History:  Diagnosis Date  . Asthma   . Brain tumor [facility]) 2007   Optical center on the R side of the brain  . Coronary artery disease    stents placed 2002  . Diabetes mellitus without complication ([place])   . Hypercholesterolemia   . Hypertension   . Hypothyroidism     Surgical History: Past Surgical History:  Procedure Laterality Date  . CARDIAC  CATHETERIZATION     2 stents  . INCISION AND DRAINAGE     scrotum  2014  . TONSILLECTOMY      Home Medications:  Allergies as of 02/09/2020   No Known Allergies     Medication List       Accurate as of February 09, 2020  8:22 AM. If you have any questions, ask your nurse or doctor.        acetaminophen 500 MG tablet Commonly known as: TYLENOL Take 500 mg by mouth every 6 (six) hours as needed for mild pain.   aspirin EC 81 MG tablet Take 81 mg by mouth daily.   esomeprazole 40 MG capsule Commonly known as: NEXIUM Take 40 mg by mouth daily as needed (reflux).   famotidine 10 MG tablet Commonly known as: PEPCID Take by mouth.   Fish Oil 1000 MG Caps Take 1,000 mg by mouth 3 (three) times daily.   fluticasone 50 MCG/ACT nasal spray Commonly known as: FLONASE Place 2 sprays into both nostrils daily.   glipiZIDE 2.5 MG 24 hr tablet Commonly known as: GLUCOTROL XL Take 2.5 mg by mouth daily.   ibuprofen 200 MG tablet Commonly known as: ADVIL Take by mouth.   levothyroxine 88 MCG tablet Commonly known as: SYNTHROID Take 88 mcg by mouth daily before breakfast.   metoprolol succinate 25 MG 24 hr tablet Commonly known as: TOPROL-XL Take 1 tablet by mouth daily.   multivitamin capsule Take 1 capsule by mouth daily.   RABEprazole 20 MG tablet  Commonly known as: ACIPHEX Take by mouth.   sildenafil 20 MG tablet Commonly known as: REVATIO 2 tabs 1 hour prior to intercourse   simvastatin 20 MG tablet Commonly known as: ZOCOR Take 20 mg by mouth daily.   sucralfate 1 g tablet Commonly known as: CARAFATE Take by mouth.   Symbicort 160-4.5 MCG/ACT inhaler Generic drug: budesonide-formoterol SMARTSIG:2 Inhalation Via Inhaler Twice Daily       Allergies: No Known Allergies  Family History: No family history on file.  Social History:  reports that he has never smoked. He has never used smokeless tobacco. He reports that he does not drink alcohol and  does not use drugs.   Physical Exam: There were no vitals taken for this visit.  Constitutional:  Alert and oriented, No acute distress. HEENT: [place] AT, moist mucus membranes.  Trachea midline, no masses. Cardiovascular: No clubbing, cyanosis, or edema. Respiratory: Normal respiratory effort, no increased work of breathing. GI: Abdomen is soft, nontender, nondistended, no abdominal masses GU: No CVA tenderness Lymph: No cervical or inguinal lymphadenopathy. Skin: No rashes, bruises or suspicious lesions. Neurologic: Grossly intact, no focal deficits, moving all 4 extremities. Psychiatric: Normal mood and affect.  Laboratory Data: No results found for: WBC, HGB, HCT, MCV, PLT  Lab Results  Component Value Date   CREATININE 0.82 11/09/2019    No results found for: PSA  No results found for: TESTOSTERONE  No results found for: HGBA1C  Urinalysis    Component Value Date/Time   APPEARANCEUR Cloudy (A) 12/10/2019 1022   GLUCOSEU Trace (A) 12/10/2019 1022   BILIRUBINUR Negative 12/10/2019 1022   PROTEINUR 1+ (A) 12/10/2019 1022   NITRITE Negative 12/10/2019 1022   LEUKOCYTESUR 1+ (A) 12/10/2019 1022    Lab Results  Component Value Date   LABMICR See below: 12/10/2019   WBCUA >30 (A) 12/10/2019   LABEPIT 0-10 12/10/2019   BACTERIA Few 12/10/2019    Pertinent Imaging: *** Results for orders placed during the hospital encounter of 02/15/14  DG Abd 1 View  Narrative CLINICAL DATA:  Preoperative exam prior to cervical for left-sided kidney stone  EXAM: ABDOMEN - 1 VIEW  COMPARISON:  None.  FINDINGS: There is an 11 x 5 mm calcification projecting over the lower third of the left ureter. No other stones projecting over the ureters are demonstrated. No bladder stones are evident. The bowel gas pattern and bony structures exhibit no acute abnormalities.  IMPRESSION: There is a calcification consistent with a distal left ureteral stone.   Electronically  Signed By: David  [place] On: 02/15/2014 07:52  No results found for this or any previous visit.  No results found for this or any previous visit.  No results found for this or any previous visit.  No results found for this or any previous visit.  No results found for this or any previous visit.  Results for orders placed during the hospital encounter of 11/09/19  CT HEMATURIA WORKUP  Narrative CLINICAL DATA:  79 year old male with history of microscopic hematuria. Prior history of kidney stones requiring lithotripsy 5 years ago.  EXAM: CT ABDOMEN AND PELVIS WITHOUT AND WITH CONTRAST  TECHNIQUE: Multidetector CT imaging of the abdomen and pelvis was performed following the standard protocol before and following the bolus administration of intravenous contrast.  CONTRAST:  143mL OMNIPAQUE IOHEXOL 300 MG/ML  SOLN  COMPARISON:  CT the abdomen and pelvis 09/16/2017.  FINDINGS: Lower chest: 2 mm right lower lobe pulmonary nodule (axial image 12 of series 5), stable  compared to prior study from 2019, considered definitively benign. Atherosclerotic calcifications in the descending thoracic aorta as well as the left circumflex and right coronary arteries.  Hepatobiliary: No suspicious cystic or solid hepatic lesions. No intra or extrahepatic biliary ductal dilatation. Gallbladder is normal in appearance.  Pancreas: Small hypervascular lesion in the anterior aspect of the head of the pancreas (axial image 22 of series 7 and coronal image 41 of series 10) measuring 0.6 x 0.5 x 0.5 cm. No other pancreatic mass. No pancreatic ductal dilatation. No pancreatic or peripancreatic fluid collections or inflammatory changes.  Spleen: Unremarkable.  Adrenals/Urinary Tract: No calcifications are identified within the collecting system of either kidney, along the course of either ureter, or within the lumen of the urinary bladder. There is no hydroureteronephrosis. In the interpolar  region of the right kidney (axial image 30 of series 7) there is a partially exophytic 1.6 x 1.1 cm lesion which is intermediate attenuation (46 HU), but does not enhance, compatible with a proteinaceous/hemorrhagic cyst. There is a dependent calcification within this lesion which is mobile during prone imaging. Several subcentimeter low-attenuation lesions are noted throughout the left kidney, too small to characterize, but statistically likely to represent tiny cysts. On postcontrast delayed imaging there are no definite filling defects within the collecting system of either kidney, along the course of either ureter, or within the lumen of the urinary bladder. However, there is very subtle enhancement of the left ureterovesicular junction best appreciated on the coronal image 69 of series 10. Urinary bladder appears mildly thickened and trabeculated, without definite focal lesion. Bilateral adrenal glands are normal in appearance.  Stomach/Bowel: Normal appearance of the stomach. No pathologic dilatation of small bowel or colon. Normal appendix.  Vascular/Lymphatic: Aortic atherosclerosis, without evidence of aneurysm or dissection in the abdominal or pelvic vasculature. No lymphadenopathy noted in the abdomen or pelvis.  Reproductive: Median lobe hypertrophy in the prostate gland. Seminal vesicles are unremarkable in appearance.  Other: No significant volume of ascites.  No pneumoperitoneum.  Musculoskeletal: There are no aggressive appearing lytic or blastic lesions noted in the visualized portions of the skeleton.  IMPRESSION: 1. Subtle hyperenhancement of the left ureterovesicular junction. The possibility of a very small urothelial lesion in this region is not excluded. Correlation with cystoscopy is recommended. 2. No urinary tract calculi. 3. 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 4. Subcentimeter low-attenuation lesions in the left kidney,  too small to characterize, but statistically likely to represent tiny cysts. 5. 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 The possibility of a small neuroendocrine neoplasm warrants consideration, and further evaluation with nonemergent abdominal MRI with and without IV gadolinium is recommended in the near future to better evaluate this finding. 6. Aortic atherosclerosis, in addition to at least 2 vessel coronary artery disease. Assessment for potential risk factor modification, dietary therapy or pharmacologic therapy may be warranted, if clinically indicated.   Electronically Signed By: Vinnie Langton M.D. On: 11/09/2019 17:00  No results found for this or any previous visit.   Assessment & Plan:    There are no diagnoses linked to this encounter.  No follow-ups on file.  Hollice Espy, MD  [facility] [address], [place], [place] [address] [phone]

## 2020-02-09 NOTE — Progress Notes (Signed)
02/17/2020 3:13 PM   Alex Lambert [birth year] [id number]  Referring provider: [name], MD [address] [facility] [place],  [place] [address]  No chief complaint on file.   HPI: 79 year old male previously patient of [name], also seen by [name] who presents today for follow-up.  He is requesting a new provider and is new to me today.  Most recently, he was seen and evaluated by [name] in 09/18/2019.  At that point time, he was complaining of urinary symptoms, primarily nocturia urgency and frequency.  He tried saw palmetto.  He also was noted to have incidental microscopic hematuria.  He underwent CT urogram on 11/09/2019 which showed some subtle hyperenhancement of the left UVJ.  Cystoscopy revealed a moderate median lobe and visualization of the UVJ was suboptimal due to his anatomy.  Cystoscopy in the operating room, possible biopsy versus TURBT was discussed.  The patient declined.  Plans were made for CT scan in 6 months as well as urine cytology.  He also had an indeterminate right intrapolar cyst measuring 1.6 cm.  This was followed up with an abd MRI ordered by his PCP, Dr. [name] which showed that the renal cyst in question was c/w Bosniak 2 cyst.    Following cystoscopy, patient complained of dysuria.  He was prescribed empiric Cipro.  He did not take this medication due to concern for side effects.  He was seen several weeks later in clinic which time his urine was grossly positive.  He ended up growing Enterococcus.  He was treated with Bactrim because he refused Cipro but reports today that he did ultimately just take the cipro after a lot of back and forth.  He does also feel like his recent shoulder pain is related to cipro.    He mentions today that all of his dysuria resolved but he does have some irritation at the tip of his penis exacerbated by rubbing on his pants and with showering.  This is new.  He says the tip of his  penis is raw.  He is uncircumcised.  He is able to pull back the foreskin.  He also complained that he developed new onset acute erectile dysfunction following cystoscopy.  He has a remote history of nephrolithiasis previously requiring surgical intervention.    PMH: Past Medical History:  Diagnosis Date  . Asthma   . Brain tumor [facility]) 2007   Optical center on the R side of the brain  . Coronary artery disease    stents placed 2002  . Diabetes mellitus without complication ([place])   . Hypercholesterolemia   . Hypertension   . Hypothyroidism     Surgical History: Past Surgical History:  Procedure Laterality Date  . CARDIAC CATHETERIZATION     2 stents  . INCISION AND DRAINAGE     scrotum  2014  . TONSILLECTOMY      Home Medications:  Allergies as of 02/17/2020   No Known Allergies     Medication List       Accurate as of February 17, 2020 11:59 PM. If you have any questions, ask your nurse or doctor.        STOP taking these medications   famotidine 10 MG tablet Commonly known as: PEPCID Stopped by: Hollice Espy, MD   ibuprofen 200 MG tablet Commonly known as: ADVIL Stopped by: Hollice Espy, MD   metoprolol succinate 25 MG 24 hr tablet Commonly known as: TOPROL-XL Stopped by: Hollice Espy, MD  RABEprazole 20 MG tablet Commonly known as: ACIPHEX Stopped by: Hollice Espy, MD   sildenafil 20 MG tablet Commonly known as: REVATIO Stopped by: Hollice Espy, MD   sucralfate 1 g tablet Commonly known as: CARAFATE Stopped by: Hollice Espy, MD     TAKE these medications   acetaminophen 500 MG tablet Commonly known as: TYLENOL Take 500 mg by mouth every 6 (six) hours as needed for mild pain.   aspirin EC 81 MG tablet Take 81 mg by mouth daily.   esomeprazole 40 MG capsule Commonly known as: NEXIUM Take 40 mg by mouth daily as needed (reflux).   Fish Oil 1000 MG Caps Take 1,000 mg by mouth 3 (three) times daily.   fluticasone 50  MCG/ACT nasal spray Commonly known as: FLONASE Place 2 sprays into both nostrils daily.   glipiZIDE 2.5 MG 24 hr tablet Commonly known as: GLUCOTROL XL Take 2.5 mg by mouth daily.   levothyroxine 100 MCG tablet Commonly known as: SYNTHROID Take 100 mcg by mouth daily. What changed: Another medication with the same name was removed. Continue taking this medication, and follow the directions you see here. Changed by: Hollice Espy, MD   metoprolol tartrate 25 MG tablet Commonly known as: LOPRESSOR Take 25 mg by mouth 2 (two) times daily.   multivitamin capsule Take 1 capsule by mouth daily.   nystatin-triamcinolone ointment Commonly known as: MYCOLOG Apply 1 application topically 2 (two) times daily. Started by: Hollice Espy, MD   simvastatin 20 MG tablet Commonly known as: ZOCOR Take 20 mg by mouth daily.   Symbicort 160-4.5 MCG/ACT inhaler Generic drug: budesonide-formoterol SMARTSIG:2 Inhalation Via Inhaler Twice Daily       Allergies: No Known Allergies  Family History: No family history on file.  Social History:  reports that he has never smoked. He has never used smokeless tobacco. He reports that he does not drink alcohol and does not use drugs.   Physical Exam: BP (!) 143/75   Pulse 96   Wt 178 lb (80.7 kg)   BMI 27.88 kg/m   Constitutional:  Alert and oriented, No acute distress. HEENT: [place] AT, moist mucus membranes.  Trachea midline, no masses. Cardiovascular: No clubbing, cyanosis, or edema. Respiratory: Normal respiratory effort, no increased work of breathing. Skin: No rashes, bruises or suspicious lesions. Neurologic: Grossly intact, no focal deficits, moving all 4 extremities. Psychiatric: Normal mood and affect.  Laboratory Data  Lab Results  Component Value Date   CREATININE 0.82 11/09/2019   Urinalysis Results for orders placed or performed in visit on 02/17/20  Microscopic Examination   Urine  Result Value Ref Range   WBC, UA  0-5 0 - 5 /hpf   RBC 3-10 (A) 0 - 2 /hpf   Epithelial Cells (non renal) None seen 0 - 10 /hpf   Crystals Present (A) N/A   Crystal Type Amorphous Sediment N/A   Bacteria, UA Few None seen/Few  Urinalysis, Complete  Result Value Ref Range   Specific Gravity, UA 1.025 1.005 - 1.030   pH, UA 7.0 5.0 - 7.5   Color, UA Yellow Yellow   Appearance Ur Clear Clear   Leukocytes,UA Negative Negative   Protein,UA Negative Negative/Trace   Glucose, UA Trace (A) Negative   Ketones, UA Negative Negative   RBC, UA 1+ (A) Negative   Bilirubin, UA Negative Negative   Urobilinogen, Ur 0.2 0.2 - 1.0 mg/dL   Nitrite, UA Negative Negative   Microscopic Examination See below:  Assessment & Plan:    1. Microhematuria Persistent today on urinalysis, cytology ordered and pending  I personally reviewed his CT urogram from 10/2019 which does show a possible UVJ lesion.  He previously declined further work-up in the operating room with possible TURBT by [name] after this area in question was not able to be visualized cystoscopically secondary to elevated bladder neck/median lobe  I agree with Dr. Dene Gentry plan for most definitive intervention in the operating room however the patient is still not interested in this.  His alternative plan to repeat the CT urogram at the 15-month interval with serial cytology seems like the most reasonable compromise.  The patient is agreeable this plan.  He has multiple grievances about the in office cystoscopy experience which I listened to today at length.  I tried to reassure him and address his concerns as best as possible.  - CT HEMATURIA WORKUP; Future - Cytology, urine (performed at [facility]); Future - Cytology, urine (performed at [facility])  2. Bladder mass As above  3. Dysuria/penile pain No evidence of infection on urinalysis today  It sounds like the irritation of the tip of his penis may be related to phimosis/Candida given that he is uncircumcised.   We will treat him with Mycolog ointment and he was advised to return within a month if he fails to improve. - Urinalysis, Complete  4. Renal cyst Status post MRI for further characterization, this was also personally reviewed today.  Consistent with Bosniak 2 and no need for further surveillance or characterization  Follow-up in February for CT urogram.    Hollice Espy, MD  [facility] [address] [place], [place] [address] [phone]  I spent 40 total minutes on the day of the encounter including pre-visit review of the medical record, face-to-face time with the patient, and post visit ordering of labs/imaging/tests.

## 2020-02-10 DIAGNOSIS — I480 Paroxysmal atrial fibrillation: Secondary | ICD-10-CM | POA: Insufficient documentation

## 2020-02-17 ENCOUNTER — Other Ambulatory Visit: Payer: Self-pay

## 2020-02-17 ENCOUNTER — Ambulatory Visit: Payer: Medicare HMO | Admitting: Urology

## 2020-02-17 VITALS — BP 143/75 | HR 96 | Wt 178.0 lb

## 2020-02-17 DIAGNOSIS — R300 Dysuria: Secondary | ICD-10-CM

## 2020-02-17 DIAGNOSIS — R3129 Other microscopic hematuria: Secondary | ICD-10-CM

## 2020-02-17 DIAGNOSIS — N3289 Other specified disorders of bladder: Secondary | ICD-10-CM | POA: Diagnosis not present

## 2020-02-17 DIAGNOSIS — N281 Cyst of kidney, acquired: Secondary | ICD-10-CM

## 2020-02-17 MED ORDER — NYSTATIN-TRIAMCINOLONE 100000-0.1 UNIT/GM-% EX OINT: 1.0000 "application " | TOPICAL_OINTMENT | Freq: Two times a day (BID) | CUTANEOUS | 0 refills | Status: DC

## 2020-02-23 LAB — MICROSCOPIC EXAMINATION: Epithelial Cells (non renal): NONE SEEN /hpf (ref 0–10)

## 2020-02-23 LAB — URINALYSIS, COMPLETE
Bilirubin, UA: NEGATIVE
Ketones, UA: NEGATIVE
Leukocytes,UA: NEGATIVE
Nitrite, UA: NEGATIVE
Protein,UA: NEGATIVE
Specific Gravity, UA: 1.025 (ref 1.005–1.030)
Urobilinogen, Ur: 0.2 mg/dL (ref 0.2–1.0)
pH, UA: 7 (ref 5.0–7.5)

## 2020-02-26 ENCOUNTER — Telehealth: Payer: Self-pay | Admitting: *Deleted

## 2020-02-26 NOTE — Telephone Encounter (Signed)
Tried calling both numbers for patient and VM has not been set up on either number, will try again later.

## 2020-02-26 NOTE — Telephone Encounter (Signed)
I don't understand this question. He doesn't have cancer as far as we know and we are following him to be sure. He needs to follow up in February as scheduled.  Hollice Espy

## 2020-02-26 NOTE — Telephone Encounter (Signed)
Pt calling asking about the level of his cancer? Pt is trying to make travel arrangements to be [place] 3 weeks but wanted to be careful. If this is high grade cancer he will cut his trip short, if not he will stay the 3 weeks. Please advise

## 2020-02-26 NOTE — Telephone Encounter (Signed)
Spoke with patient and advised it is ok for him to travel and to follow-up in February.

## 2020-05-17 ENCOUNTER — Ambulatory Visit: Payer: Self-pay | Admitting: Urology

## 2020-05-27 ENCOUNTER — Telehealth: Payer: Self-pay | Admitting: *Deleted

## 2020-05-27 NOTE — Telephone Encounter (Signed)
Unable to leave VM patient needs follow up appointment with Dr. Erlene Quan rescheduled till CT complete

## 2020-05-31 ENCOUNTER — Ambulatory Visit: Payer: Self-pay | Admitting: Urology

## 2020-06-14 ENCOUNTER — Ambulatory Visit: Payer: Medicare HMO

## 2020-06-22 ENCOUNTER — Ambulatory Visit
Admission: RE | Admit: 2020-06-22 | Discharge: 2020-06-22 | Disposition: A | Discharge status: Home / Self Care | Payer: Medicare HMO | Source: Ambulatory Visit | Attending: Urology | Admitting: Urology

## 2020-06-22 ENCOUNTER — Other Ambulatory Visit: Payer: Self-pay

## 2020-06-22 DIAGNOSIS — R3129 Other microscopic hematuria: Secondary | ICD-10-CM | POA: Insufficient documentation

## 2020-06-22 LAB — POCT I-STAT CREATININE: Creatinine, Ser: 0.9 mg/dL (ref 0.61–1.24)

## 2020-06-22 MED ORDER — IOHEXOL 300 MG/ML  SOLN
125.0000 mL | Freq: Once | INTRAMUSCULAR | Status: AC | PRN
  Administered 2020-06-22: 125 mL via INTRAVENOUS

## 2020-07-12 ENCOUNTER — Encounter: Payer: Self-pay | Admitting: Urology

## 2020-07-12 ENCOUNTER — Other Ambulatory Visit: Payer: Self-pay

## 2020-07-12 ENCOUNTER — Ambulatory Visit: Payer: Medicare HMO | Admitting: Urology

## 2020-07-12 VITALS — BP 185/80 | HR 106

## 2020-07-12 DIAGNOSIS — N138 Other obstructive and reflux uropathy: Secondary | ICD-10-CM

## 2020-07-12 DIAGNOSIS — N401 Enlarged prostate with lower urinary tract symptoms: Secondary | ICD-10-CM

## 2020-07-12 DIAGNOSIS — N4889 Other specified disorders of penis: Secondary | ICD-10-CM

## 2020-07-12 DIAGNOSIS — N3289 Other specified disorders of bladder: Secondary | ICD-10-CM

## 2020-07-12 DIAGNOSIS — L723 Sebaceous cyst: Secondary | ICD-10-CM

## 2020-07-12 DIAGNOSIS — R3129 Other microscopic hematuria: Secondary | ICD-10-CM

## 2020-07-12 MED ORDER — FINASTERIDE 5 MG PO TABS: 5.0000 mg | ORAL_TABLET | Freq: Every day | ORAL | 11 refills | Status: DC

## 2020-07-12 NOTE — Patient Instructions (Signed)
sebaceous cyst

## 2020-07-12 NOTE — Progress Notes (Signed)
07/12/2020 2:30 PM   Alex Lambert [birth year] [id number]  Referring provider: [name], MD [address] [place],  [place] [address]  Chief Complaint  Patient presents with  . Dysuria    HPI: 80 year old male with complex GU history including questionable UVJ lesion who returns today for follow-up.  Please see previous notes for details.  Briefly, he underwent CT urogram in 10/2019 with possible left UVJ lesion.  Cystoscopy revealed moderate median lobe.  Ultimately, he elected conservative management and returns today with a CT urogram and for cytology.  Has has a Bosniak 2 renal cyst which remained stable and right interpolar location.  He mentions today that he is primarily bothered by nocturia.  He gets up 3 or 4 times at night to urinate which is bothersome.  It interrupts his rest.  He thinks it is better than his peers but still is somewhat disruptive.  He also mentions today that he has a lesion on his right suprapubic area.  His wife wanted to have him check it out today.  He denies any pain or discomfort from the area.  He also reports he tried to use the cream previously prescribed for penile irritation.  He ask his pharmacist how to use the medication and was told to pull back his foreskin completely apply and then pull his foreskin back forward.  He reports that he was having some trouble pulling his foreskin back forward due to lubrication after applying the medication and thus stopped using the medication altogether.   IPSS    Row Name 07/12/20 1400         International Prostate Symptom Score   How often have you had the sensation of not emptying your bladder? Not at All     How often have you had to urinate less than every two hours? Less than 1 in 5 times     How often have you found you stopped and started again several times when you urinated? Less than 1 in 5 times     How often have you found it difficult to postpone  urination? Less than 1 in 5 times     How often have you had a weak urinary stream? Not at All     How often have you had to strain to start urination? Not at All     How many times did you typically get up at night to urinate? 3 Times     Total IPSS Score 6           Quality of Life due to urinary symptoms   If you were to spend the rest of your life with your urinary condition just the way it is now how would you feel about that? Mostly Satisfied            Score:  1-7 Mild 8-19 Moderate 20-35 Severe    PMH: Past Medical History:  Diagnosis Date  . Asthma   . Brain tumor [facility]) 2007   Optical center on the R side of the brain  . Coronary artery disease    stents placed 2002  . Diabetes mellitus without complication ([place])   . Hypercholesterolemia   . Hypertension   . Hypothyroidism     Surgical History: Past Surgical History:  Procedure Laterality Date  . CARDIAC CATHETERIZATION     2 stents  . INCISION AND DRAINAGE     scrotum  2014  . TONSILLECTOMY  Home Medications:  Allergies as of 07/12/2020   No Known Allergies     Medication List       Accurate as of July 12, 2020  2:30 PM. If you have any questions, ask your nurse or doctor.        STOP taking these medications   metoprolol tartrate 25 MG tablet Commonly known as: LOPRESSOR Stopped by: Hollice Espy, MD     TAKE these medications   acetaminophen 500 MG tablet Commonly known as: TYLENOL Take 500 mg by mouth every 6 (six) hours as needed for mild pain.   aspirin EC 81 MG tablet Take 81 mg by mouth daily.   diltiazem 120 MG 24 hr capsule Commonly known as: CARDIZEM CD Take 120 mg by mouth daily.   esomeprazole 40 MG capsule Commonly known as: NEXIUM Take 40 mg by mouth daily as needed (reflux).   Fish Oil 1000 MG Caps Take 1,000 mg by mouth 3 (three) times daily.   fluticasone 50 MCG/ACT nasal spray Commonly known as: FLONASE Place 2 sprays into both nostrils daily.    glipiZIDE 2.5 MG 24 hr tablet Commonly known as: GLUCOTROL XL Take 2.5 mg by mouth daily.   levothyroxine 100 MCG tablet Commonly known as: SYNTHROID Take 100 mcg by mouth daily.   meloxicam 15 MG tablet Commonly known as: MOBIC Take 1 tablet by mouth daily.   multivitamin capsule Take 1 capsule by mouth daily.   nystatin-triamcinolone ointment Commonly known as: MYCOLOG Apply 1 application topically 2 (two) times daily.   simvastatin 20 MG tablet Commonly known as: ZOCOR Take 20 mg by mouth daily.   Symbicort 160-4.5 MCG/ACT inhaler Generic drug: budesonide-formoterol SMARTSIG:2 Inhalation Via Inhaler Twice Daily       Allergies: No Known Allergies  Family History: History reviewed. No pertinent family history.  Social History:  reports that he has never smoked. He has never used smokeless tobacco. He reports that he does not drink alcohol and does not use drugs.   Physical Exam: BP (!) 185/80   Pulse (!) 106   Constitutional:  Alert and oriented, No acute distress. HEENT: [place] AT, moist mucus membranes.  Trachea midline, no masses. Cardiovascular: No clubbing, cyanosis, or edema. Respiratory: Normal respiratory effort, no increased work of breathing. GU: Phimosis.  1 x 0.5 cm left suprapubic what appears to be sebaceous cyst just lateral to his phallus. Skin: No rashes, bruises or suspicious lesions. Neurologic: Grossly intact, no focal deficits, moving all 4 extremities. Psychiatric: Normal mood and affect.  Laboratory Data:  Lab Results  Component Value Date   CREATININE 0.90 06/22/2020   PSA 1.04 11/21  Urinalysis UA with 3-10 red blood cells per high-power field, otherwise unremarkable  Pertinent Imaging:  CT HEMATURIA WORKUP  Narrative CLINICAL DATA:  Follow-up microscopic hematuria  EXAM: CT ABDOMEN AND PELVIS WITHOUT AND WITH CONTRAST  TECHNIQUE: Multidetector CT imaging of the abdomen and pelvis was performed following the standard  protocol before and following the bolus administration of intravenous contrast.  CONTRAST:  19mL OMNIPAQUE IOHEXOL 300 MG/ML  SOLN  COMPARISON:  CT abdomen pelvis, 11/09/2019, MR abdomen, 01/22/2020  FINDINGS: Lower chest: No acute abnormality.  Coronary artery calcifications.  Hepatobiliary: No solid liver abnormality is seen. No gallstones, gallbladder wall thickening, or biliary dilatation.  Pancreas: A subcentimeter hyperenhancing lesion of the anterior pancreatic head is again seen, not significantly changed, measuring approximately 6 mm (series 10, image 23). No pancreatic ductal dilatation or surrounding inflammatory changes.  Spleen: Normal  in size without significant abnormality.  Adrenals/Urinary Tract: Adrenal glands are unremarkable. Benign, high attenuation hemorrhagic or proteinaceous cyst of the midportion of the right kidney. Benign subcentimeter simple cysts of the left kidney. Kidneys are otherwise normal, without renal calculi, solid lesion, or hydronephrosis. No evidence of urinary tract filling defect on delayed phase imaging. Bladder is unremarkable.  Stomach/Bowel: Stomach is within normal limits. Appendix appears normal. No evidence of bowel wall thickening, distention, or inflammatory changes.  Vascular/Lymphatic: Aortic atherosclerosis. No enlarged abdominal or pelvic lymph nodes.  Reproductive: Mild Prostatomegaly with median lobe hypertrophy.  Other: Fat containing bilateral inguinal hernias. No abdominopelvic ascites.  Musculoskeletal: No acute or significant osseous findings.  IMPRESSION: 1. No urinary tract calculus or suspicious renal lesion. No evidence of urinary tract filling defect on delayed phase imaging. 2. Prostatomegaly with median lobe hypertrophy. 3. A subcentimeter hyperenhancing lesion of the anterior pancreatic head is again seen, not significantly changed, measuring approximately 6 mm. There is no correlate for this lesion  identified on prior MRI, and this remains of uncertain significance. 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 4. Coronary artery disease.  Aortic Atherosclerosis (ICD10-I70.0).   Electronically Signed By: Eddie Candle M.D. On: 06/23/2020 13:01  I personally reviewed the images of his CT urogram today.  This is compared to his previous CT urogram 6 months ago.  Today, there is no UVJ abnormality.  There is in fact prostamegaly with intravesical protrusion of his median lobe.   Assessment & Plan:    1. Bladder mass Previous concern for possible left UVJ lesion, not appreciated on follow-up CT urogram which is reassuring  Urine cytology today for completeness  Cystoscopy previously unremarkable other than for an enlarged median lobe  If cytology is negative, no further work-up necessary at this point - Urinalysis, Complete - Cytology - Non PAP;  2. Microhematuria Persistent microscopic hematuria status post CT urogram x2 as well as urine cytology and cystoscopy  Likely secondary to prostamegaly  3. BPH with obstruction/lower urinary tract symptoms Extremely lengthy discussion today about risk and benefits of intervention versus pharmacotherapy and how this relates to bladder health  Seems to be somewhat bothered by his nocturia and concerned about progression, currently on saw [place]  Given his history of low blood pressure, prefer to avoid alpha-blocker such as Flomax.  He may benefit from finasteride.  Risk and benefits of this were explained in detail including how may affect his libido as well as previous blackbox warning.  All questions answered.  He like to try finasteride.  Plan to reassess his urinary symptoms in 6 months  4. Sebaceous cyst Asymptomatic left suprapubic sebaceous cyst, discussed excision versus surveillance, prefer surveillance  5. Penile pain Personal history of phimosis, see above  Advised that he can  use this medication without completely retracting his foreskin, discussed alternative application techniques as needed   Follow-up in 6 months with IPSS/PVR  Hollice Espy, MD  [facility] [address], [place], [place] [address] [phone]  I spent 40 total minutes on the day of the encounter including pre-visit review of the medical record, face-to-face time with the patient, and post visit ordering of labs/imaging/tests.

## 2020-07-14 LAB — MICROSCOPIC EXAMINATION: Bacteria, UA: NONE SEEN

## 2020-07-14 LAB — URINALYSIS, COMPLETE
Bilirubin, UA: NEGATIVE
Leukocytes,UA: NEGATIVE
Nitrite, UA: NEGATIVE
Protein,UA: NEGATIVE
Specific Gravity, UA: 1.02 (ref 1.005–1.030)
Urobilinogen, Ur: 0.2 mg/dL (ref 0.2–1.0)
pH, UA: 7 (ref 5.0–7.5)

## 2020-07-14 LAB — CYTOLOGY - NON PAP

## 2020-12-23 ENCOUNTER — Other Ambulatory Visit: Payer: Self-pay | Admitting: Neurosurgery

## 2020-12-23 DIAGNOSIS — D329 Benign neoplasm of meninges, unspecified: Secondary | ICD-10-CM

## 2020-12-27 ENCOUNTER — Encounter: Payer: Self-pay | Admitting: Physical Therapy

## 2020-12-27 ENCOUNTER — Ambulatory Visit: Payer: Medicare HMO | Attending: Orthopedic Surgery | Admitting: Physical Therapy

## 2020-12-27 DIAGNOSIS — G8929 Other chronic pain: Secondary | ICD-10-CM | POA: Diagnosis present

## 2020-12-27 DIAGNOSIS — M25511 Pain in right shoulder: Secondary | ICD-10-CM | POA: Diagnosis not present

## 2020-12-28 NOTE — Therapy (Signed)
[facility] [address], [place], [address] Phone: [phone]   Fax:  [phone]  Physical Therapy Evaluation  Patient Details  Name: Alex Lambert MRN: [id number] Date of Birth: 1940/04/16 No data recorded  Encounter Date: 12/27/2020   PT End of Session - 12/28/20 1052     Visit Number 1    Number of Visits 17    Date for PT Re-Evaluation 02/24/21    Authorization Type 1    Authorization Time Period 10    PT Start Time 1523    PT Stop Time 1600    PT Time Calculation (min) 37 min    Activity Tolerance Patient tolerated treatment well    Behavior During Therapy [facility] for tasks assessed/performed             Past Medical History:  Diagnosis Date   Asthma    Brain tumor ([place]) 2007   Optical center on the R side of the brain   Coronary artery disease    stents placed 2002   Diabetes mellitus without complication ([place])    Hypercholesterolemia    Hypertension    Hypothyroidism     Past Surgical History:  Procedure Laterality Date   CARDIAC CATHETERIZATION     2 stents   INCISION AND DRAINAGE     scrotum  2014   TONSILLECTOMY      There were no vitals filed for this visit.    Subjective Assessment - 12/27/20 1603     Subjective Alex Lambert is a 80 y.o. male who presents to therapy with chronic R shoulder pain. He reports that his pain occured insideously denying MOI. He reports his pain is aggravated by lifitng, reaching, and carrying, He reports R shoulder pain limits him from performing ADLs such as dressing, self-care, household chores, and sleeping. He reports his pain as a 4/10 on NPRS. He reports using heat/ice as alleviators. 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  Pt denies any unexplained weight fluctuation, saddle paresthesia, unrelenting night pain, or falls in the last 6 months. Pt has a PMH of DM, HTN, MF, AFIB, and meningioma.     Pertinent History Exam limited d/t patient arriving late and having difficulty staying directed to evaluation of R shoulder, patient with several other ailments in his LEs, that he is unable to be re-directed from for quite some time. Alex Lambert is a 80 y.o. male who presents to therapy with chronic R shoulder pain. He reports that his pain occured insideously denying MOI. He reports his pain is aggravated by lifitng, reaching, and carrying, He reports R shoulder pain limits him from performing ADLs such as dressing, self-care, household chores, and sleeping. He reports his pain as a 4/10 on NPRS. He reports using heat/ice as alleviators. 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 .  Pt denies any unexplained weight fluctuation, saddle paresthesia, unrelenting night pain, or falls in the last 6 months. Pt has a PMH of DM, HTN, MF, AFIB, and meningioma.    Limitations Lifting;Other (comment);House hold activities    How long can you sit comfortably? na    How long can you stand comfortably? na    How long can you walk comfortably? na    Diagnostic tests xray    Patient  Stated Goals decrease pain    Currently in Pain? Yes    Pain Score 4     Pain Location Shoulder    Pain Orientation Right    Pain Descriptors / Indicators Aching    Pain Type Chronic pain    Pain Radiating Towards R UE    Pain Onset More than a month ago    Pain Frequency Constant    Aggravating Factors  lifting, reaching, sleeping    Pain Relieving Factors heat/ice    Effect of Pain on Daily Activities limits exercise and ADLS              Cervical ROM L/R  WFL   Shoulder AROM R deg  Flexion:130 Abduction:130 ER: c5/ c5 IR: T10/T10 L shoulder AROM flex and abd limited to approx 160d; full ER and IR on L shoulder    Shoulder PROM R  deg Flex 130 Abd 130 ER 85 IR 80   Shoulder Strength L/R Flexion: 4/4+ Abduction:4/4+ IR: 4/4+ ER:4/4-   Special Tests: Subacromial Impingement (cluster) *Hawkin's Kennedy (+) *Neer's (-) * Painful Arc (-)   Rotator cuff tear Empty Can (-) Full Can(+)   Labral Tear Biceps Load II (120 elevation, full ER, 90 elbow flexion, full supination, resisted elbow flexion): (+) Active Compression Test/Obrien's (+)  Bicep Tendon Pathology Speed (+) Yergason's   Shoulder Instability Sulcus Sign: (-) Anterior Apprehension (-)    UE Sensation: appears intact bilaterally   Posture: Forward head, rounder shoulders, increased thoracic kyphosis   Palpation: Increase TTP R upper trap and pec minor, and proximal biceps tendon      Objective measurements completed on examination: See above findings.               PT Education - 12/28/20 1020     Education provided Yes    Education Details educated on deficits identified in examination and PT POC and duration of physical therapy    Person(s) Educated Patient    Methods Explanation    Comprehension Verbalized understanding              PT Short Term Goals - 12/28/20 0928       PT SHORT TERM GOAL #1   Title Pt will demonstrate independence with HEP to improve R shoulder function for increased ability to participate with ADLs    Baseline deferred    Time 4    Period Weeks    Status New               PT Long Term Goals - 12/28/20 0929       PT LONG TERM GOAL #1   Title Patient will increase FOTO score to 67 to demonstrate predicted increase in functional mobility to complete ADLs    Baseline 12/28/20 56    Time 8    Period Weeks    Status New    Target Date 02/22/21      PT LONG TERM GOAL #2   Title Patient will improve R shoulder strength to 4+/5 or for flexion, abd, IR, and ER in order to improve ability to perform self care and moderate household chores.    Baseline 12/27/20: Flexion:  4/5  ABD 4/5  IR: 4/5  ER: 4-/5    Time 8    Period Weeks    Status New    Target Date 02/22/21      PT LONG TERM GOAL #3   Title Pt will decrease worst pain as reported on  NPRS by at least 2 points in order to demonstrate clinically significant reduction in R shoulder pain.    Baseline 12/27/20: 4    Time 8    Period Weeks    Status New    Target Date 02/22/21                    Plan - 12/28/20 5366     Clinical Impression Statement Pt is a 80 y.o male presenting today with R shoulder pain. Exam limited by patient inability to be redirected despite multiple attempts, to perform subjective and objective measures of R shoulder. Possible s/s of R labral tear differential biceps tendinopathy evidenced by increased pain with active compression of R GH joint and resisted elbow flexion.. Patient presents with impairments of decreased shoulder/periscapular strength, AROM, postural dysfunction, decreased scapulo-humeral rhythm, and pain. Activity limitations with lifting, reaching, carrying, pulling, and sleeping prohibiting participation in completing ADLs household chores and recreational activities. Patient will benefit from skilled PT to address these impairments and return to PLOF.    Personal Factors and Comorbidities Age;Time since onset of injury/illness/exacerbation    Examination-Activity Limitations Sleep;Dressing;Lift;Carry;Reach Overhead    Examination-Participation Forensic scientist;Yard Work    Merchant navy officer Evolving/Moderate complexity    Clinical Decision Making Moderate    Rehab Potential Good    Clinical Impairments Affecting Rehab Potential --    PT Frequency 2x / week    PT Duration 4 weeks    PT Treatment/Interventions ADLs/Self Care Home Management;Cryotherapy;Moist Heat;Traction;Therapeutic exercise;Therapeutic activities;Neuromuscular re-education;Patient/family education;Manual techniques;Passive range of motion;Joint  Manipulations;Dry needling;Electrical Stimulation;Iontophoresis 4mg /ml Dexamethasone;Functional mobility training;Balance training    PT Next Visit Plan HEP, periscapular MMTs    PT Home Exercise Plan not given    Consulted and Agree with Plan of Care Patient             Patient will benefit from skilled therapeutic intervention in order to improve the following deficits and impairments:  Pain, Postural dysfunction, Decreased strength, Decreased activity tolerance, Decreased range of motion, Decreased mobility, Decreased endurance, Decreased coordination, Impaired flexibility, Increased fascial restricitons, Impaired UE functional use, Improper body mechanics  Visit Diagnosis: Chronic right shoulder pain     Problem List Patient Active Problem List   Diagnosis Date Noted   BPH with obstruction/lower urinary tract symptoms 10/22/2019   Microhematuria 10/22/2019   CAD (coronary artery disease) 10/14/2017   DM2 (diabetes mellitus, type 2) ([place]) 10/14/2017   HLD (hyperlipidemia) 10/14/2017   HTN (hypertension) 10/14/2017   Hypothyroidism, unspecified 10/14/2017   Nephrolithiasis 10/14/2017   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 [place], [address] Phone: [phone]   Fax:  [phone]  Name: CAMDAN BURDI MRN: [id number] Date of Birth: 1940-08-08

## 2020-12-29 ENCOUNTER — Ambulatory Visit: Payer: Medicare HMO | Admitting: Physical Therapy

## 2020-12-29 ENCOUNTER — Encounter: Payer: Self-pay | Admitting: Physical Therapy

## 2020-12-29 DIAGNOSIS — G8929 Other chronic pain: Secondary | ICD-10-CM

## 2020-12-29 DIAGNOSIS — M25511 Pain in right shoulder: Secondary | ICD-10-CM | POA: Diagnosis not present

## 2020-12-29 NOTE — Therapy (Signed)
[facility] [address], [place], [address] Phone: [phone]   Fax:  [phone]  Physical Therapy Treatment  Patient Details  Name: Alex Lambert MRN: [id number] Date of Birth: 10/22/1940 No data recorded  Encounter Date: 12/29/2020   PT End of Session - 12/29/20 1649     Visit Number 2    Number of Visits 17    Date for PT Re-Evaluation 02/24/21    Authorization Type 2    Authorization Time Period 10    PT Start Time 1604    PT Stop Time 1644    PT Time Calculation (min) 40 min    Activity Tolerance Patient tolerated treatment well    Behavior During Therapy [facility] for tasks assessed/performed             Past Medical History:  Diagnosis Date   Asthma    Brain tumor ([place]) 2007   Optical center on the R side of the brain   Coronary artery disease    stents placed 2002   Diabetes mellitus without complication ([place])    Hypercholesterolemia    Hypertension    Hypothyroidism     Past Surgical History:  Procedure Laterality Date   CARDIAC CATHETERIZATION     2 stents   INCISION AND DRAINAGE     scrotum  2014   TONSILLECTOMY      There were no vitals filed for this visit.   Subjective Assessment - 12/29/20 1616     Subjective Pt reports R shoulder soreness from evaluation, however, denies any pain this session.    Pertinent History Exam limited d/t patient arriving late and having difficulty staying directed to evaluation of R shoulder, patient with several other ailments in his LEs, that he is unable to be re-directed from for quite some time. Alex Lambert is a 80 y.o. male who presents to therapy with chronic R shoulder pain. He reports that his pain occured insideously denying MOI. He reports his pain is aggravated by lifitng, reaching, and carrying, He reports R shoulder pain limits him from performing ADLs such as dressing, self-care, household chores, and sleeping. He reports  his pain as a 4/10 on NPRS. He reports using heat/ice as alleviators. 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 .  Pt denies any unexplained weight fluctuation, saddle paresthesia, unrelenting night pain, or falls in the last 6 months. Pt has a PMH of DM, HTN, MF, AFIB, and meningioma.    Limitations Lifting;Other (comment);House hold activities    How long can you sit comfortably? na    How long can you stand comfortably? na    How long can you walk comfortably? na    Diagnostic tests xray    Patient Stated Goals decrease pain              Therex  TRX AAROM Flex/ABD x 10 reps   MMT R/L I: 4-/4 Y: 4-/4 T; 4/4   Standing IR GTB 3 x 10 reps with cueing to ensure elbow at side  Standing ER GTB 3 x 10 reps cueing to ensure elbow at side  Theraband Row GTB 2 x 10 reps cueing for scapular retraction  Shoulder Extension GTB 3 x 10 reps cueing for scapular retraction        PT Education - 12/29/20  [place]     Education provided Yes    Education Details therex form/technique    Person(s) Educated Patient    Methods Explanation;Demonstration    Comprehension Verbalized understanding;Returned demonstration              PT Short Term Goals - 12/28/20 0928       PT SHORT TERM GOAL #1   Title Pt will demonstrate independence with HEP to improve R shoulder function for increased ability to participate with ADLs    Baseline deferred    Time 4    Period Weeks    Status New               PT Long Term Goals - 12/28/20 0929       PT LONG TERM GOAL #1   Title Patient will increase FOTO score to 67 to demonstrate predicted increase in functional mobility to complete ADLs    Baseline 12/28/20 56    Time 8    Period Weeks    Status New    Target Date 02/22/21      PT LONG TERM GOAL #2   Title  Patient will improve R shoulder strength to 4+/5 or for flexion, abd, IR, and ER in order to improve ability to perform self care and moderate household chores.    Baseline 12/27/20: Flexion: 4/5  ABD 4/5  IR: 4/5  ER: 4-/5    Time 8    Period Weeks    Status New    Target Date 02/22/21      PT LONG TERM GOAL #3   Title Pt will decrease worst pain as reported on NPRS by at least 2 points in order to demonstrate clinically significant reduction in R shoulder pain.    Baseline 12/27/20: 4    Time 8    Period Weeks    Status New    Target Date 02/22/21                   Plan - 12/29/20 1650     Clinical Impression Statement Pt tolerated session well with no reports of increased R shoulder pain at end of session, however, pt did report anterior R shoulder pain with theraband row. Pt educated on HEP and to discontinue any exercise that elicits concordant pain. Patient required multimodal cueing to ensure proper form with decent carry over. Patient will continue to benefit from skilled therapy for exercise performance to ensure safe and effective progression.    Personal Factors and Comorbidities Age;Time since onset of injury/illness/exacerbation    Examination-Activity Limitations Sleep;Dressing;Lift;Carry;Reach Overhead    Examination-Participation Forensic scientist;Yard Work    Merchant navy officer Evolving/Moderate complexity    Clinical Decision Making Moderate    Rehab Potential Good    PT Frequency 2x / week    PT Duration 4 weeks    PT Treatment/Interventions ADLs/Self Care Home Management;Cryotherapy;Moist Heat;Traction;Therapeutic exercise;Therapeutic activities;Neuromuscular re-education;Patient/family education;Manual techniques;Passive range of motion;Joint Manipulations;Dry needling;Electrical Stimulation;Iontophoresis 4mg /ml Dexamethasone;Functional mobility training;Balance training    PT Next Visit Plan HEP review    PT Home  Exercise Plan ER/IR/ TROW/ Shoulder Ext    Consulted and Agree with Plan of Care Patient             Patient will benefit from skilled therapeutic intervention in order to improve the following deficits and impairments:  Pain, Postural dysfunction, Decreased strength, Decreased activity tolerance, Decreased range of motion, Decreased mobility, Decreased endurance, Decreased coordination, Impaired flexibility, Increased fascial restricitons, Impaired  UE functional use, Improper body mechanics  Visit Diagnosis: Chronic right shoulder pain     Problem List Patient Active Problem List   Diagnosis Date Noted   BPH with obstruction/lower urinary tract symptoms 10/22/2019   Microhematuria 10/22/2019   CAD (coronary artery disease) 10/14/2017   DM2 (diabetes mellitus, type 2) ([place]) 10/14/2017   HLD (hyperlipidemia) 10/14/2017   HTN (hypertension) 10/14/2017   Hypothyroidism, unspecified 10/14/2017   Nephrolithiasis 10/14/2017   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, [place], [address] Phone: [phone]   Fax:  [phone]  Name: BRAYDAN MARRIOTT MRN: [id number] Date of Birth: 1940-06-28

## 2021-01-01 ENCOUNTER — Ambulatory Visit
Admission: RE | Admit: 2021-01-01 | Discharge: 2021-01-01 | Disposition: A | Discharge status: Home / Self Care | Payer: Medicare HMO | Source: Ambulatory Visit | Attending: Neurosurgery | Admitting: Neurosurgery

## 2021-01-01 ENCOUNTER — Other Ambulatory Visit: Payer: Self-pay

## 2021-01-01 ENCOUNTER — Ambulatory Visit: Payer: Medicare HMO

## 2021-01-01 DIAGNOSIS — D329 Benign neoplasm of meninges, unspecified: Secondary | ICD-10-CM | POA: Diagnosis not present

## 2021-01-01 MED ORDER — GADOBUTROL 1 MMOL/ML IV SOLN
7.5000 mL | Freq: Once | INTRAVENOUS | Status: AC | PRN
  Administered 2021-01-01: 7.5 mL via INTRAVENOUS

## 2021-01-03 ENCOUNTER — Encounter: Payer: Self-pay | Admitting: Physical Therapy

## 2021-01-03 ENCOUNTER — Ambulatory Visit: Payer: Medicare HMO | Admitting: Physical Therapy

## 2021-01-03 DIAGNOSIS — M25511 Pain in right shoulder: Secondary | ICD-10-CM | POA: Diagnosis not present

## 2021-01-03 DIAGNOSIS — G8929 Other chronic pain: Secondary | ICD-10-CM

## 2021-01-03 NOTE — Therapy (Signed)
[place] [address], [place], [address] Phone: [phone]   Fax:  [phone]  Physical Therapy Treatment  Patient Details  Name: Alex Lambert MRN: [id number] Date of Birth: March 11, 1941 No data recorded  Encounter Date: 01/03/2021     Past Medical History:  Diagnosis Date   Asthma    Brain tumor [facility]) 2007   Optical center on the R side of the brain   Coronary artery disease    stents placed 2002   Diabetes mellitus without complication ([place])    Hypercholesterolemia    Hypertension    Hypothyroidism     Past Surgical History:  Procedure Laterality Date   CARDIAC CATHETERIZATION     2 stents   INCISION AND DRAINAGE     scrotum  2014   TONSILLECTOMY      There were no vitals filed for this visit.     Therex  UBE L1 x 2 min FWD/BWD  Theraband Row GTB 2 x 10 reps   Shoulder extension GTB 2 x10 reps with emphasis on scap retraction   Standing shoulder IR/ER 3 x 10 reps GTB with cues for standing posture and elbow tucked   R Eccentric bicep curl #4 2 x 10 reps with cues for UE placement, lowering, and concentric raising with L UE             PT Short Term Goals - 12/28/20 8938       PT SHORT TERM GOAL #1   Title Pt will demonstrate independence with HEP to improve R shoulder function for increased ability to participate with ADLs    Baseline deferred    Time 4    Period Weeks    Status New               PT Long Term Goals - 12/28/20 0929       PT LONG TERM GOAL #1   Title Patient will increase FOTO score to 67 to demonstrate predicted increase in functional mobility to complete ADLs    Baseline 12/28/20 56    Time 8    Period Weeks    Status New    Target Date 02/22/21      PT LONG TERM GOAL #2   Title Patient will improve R shoulder strength to 4+/5 or for flexion, abd, IR, and ER in order to improve ability to perform self care and moderate  household chores.    Baseline 12/27/20: Flexion: 4/5  ABD 4/5  IR: 4/5  ER: 4-/5    Time 8    Period Weeks    Status New    Target Date 02/22/21      PT LONG TERM GOAL #3   Title Pt will decrease worst pain as reported on NPRS by at least 2 points in order to demonstrate clinically significant reduction in R shoulder pain.    Baseline 12/27/20: 4    Time 8    Period Weeks    Status New    Target Date 02/22/21                     Patient will benefit from skilled therapeutic intervention in order to improve the following deficits and impairments:  Pain, Postural dysfunction, Decreased strength, Decreased activity tolerance, Decreased range of motion, Decreased mobility, Decreased endurance, Decreased coordination, Impaired flexibility, Increased fascial restricitons, Impaired UE functional use, Improper body mechanics  Visit Diagnosis: Chronic right shoulder  pain     Problem List Patient Active Problem List   Diagnosis Date Noted   BPH with obstruction/lower urinary tract symptoms 10/22/2019   Microhematuria 10/22/2019   CAD (coronary artery disease) 10/14/2017   DM2 (diabetes mellitus, type 2) ([place]) 10/14/2017   HLD (hyperlipidemia) 10/14/2017   HTN (hypertension) 10/14/2017   Hypothyroidism, unspecified 10/14/2017   Nephrolithiasis 10/14/2017   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 [place], [address] Phone: [phone]   Fax:  [phone]  Name: Alex Lambert MRN: [id number] Date of Birth:  02-20-1941

## 2021-01-05 ENCOUNTER — Ambulatory Visit: Payer: Medicare HMO | Admitting: Physical Therapy

## 2021-01-05 ENCOUNTER — Other Ambulatory Visit: Payer: Self-pay | Admitting: Orthopedic Surgery

## 2021-01-09 ENCOUNTER — Other Ambulatory Visit: Payer: Self-pay | Admitting: Orthopedic Surgery

## 2021-01-09 DIAGNOSIS — M25511 Pain in right shoulder: Secondary | ICD-10-CM

## 2021-01-10 ENCOUNTER — Encounter: Payer: Medicare HMO | Admitting: Physical Therapy

## 2021-01-12 ENCOUNTER — Ambulatory Visit: Payer: Medicare HMO | Admitting: Physical Therapy

## 2021-01-16 ENCOUNTER — Ambulatory Visit: Payer: Medicare HMO | Admitting: Physical Therapy

## 2021-01-17 ENCOUNTER — Ambulatory Visit: Payer: Self-pay | Admitting: Urology

## 2021-01-19 ENCOUNTER — Encounter: Payer: Medicare HMO | Admitting: Physical Therapy

## 2021-01-23 ENCOUNTER — Encounter: Payer: Medicare HMO | Admitting: Physical Therapy

## 2021-01-23 ENCOUNTER — Other Ambulatory Visit: Payer: Medicare HMO

## 2021-01-24 ENCOUNTER — Ambulatory Visit: Payer: Medicare HMO

## 2021-01-24 ENCOUNTER — Ambulatory Visit: Payer: Medicare HMO | Admitting: Physical Therapy

## 2021-01-25 ENCOUNTER — Ambulatory Visit
Admission: RE | Admit: 2021-01-25 | Discharge: 2021-01-25 | Disposition: A | Discharge status: Home / Self Care | Payer: Medicare HMO | Source: Ambulatory Visit | Attending: Orthopedic Surgery | Admitting: Orthopedic Surgery

## 2021-01-25 ENCOUNTER — Other Ambulatory Visit: Payer: Self-pay

## 2021-01-25 DIAGNOSIS — M25511 Pain in right shoulder: Secondary | ICD-10-CM | POA: Diagnosis not present

## 2021-01-26 ENCOUNTER — Ambulatory Visit: Payer: Medicare HMO | Admitting: Physical Therapy

## 2021-01-31 ENCOUNTER — Encounter: Payer: Medicare HMO | Admitting: Physical Therapy

## 2021-02-02 ENCOUNTER — Encounter: Payer: Medicare HMO | Admitting: Physical Therapy

## 2021-02-07 ENCOUNTER — Encounter: Payer: Medicare HMO | Admitting: Physical Therapy

## 2021-02-09 ENCOUNTER — Encounter: Payer: Medicare HMO | Admitting: Physical Therapy

## 2021-02-14 ENCOUNTER — Encounter: Payer: Medicare HMO | Admitting: Physical Therapy

## 2021-02-21 ENCOUNTER — Encounter: Payer: Medicare HMO | Admitting: Physical Therapy

## 2021-09-01 ENCOUNTER — Other Ambulatory Visit: Payer: Self-pay | Admitting: Nurse Practitioner

## 2021-09-01 DIAGNOSIS — R140 Abdominal distension (gaseous): Secondary | ICD-10-CM | POA: Insufficient documentation

## 2021-09-01 DIAGNOSIS — R634 Abnormal weight loss: Secondary | ICD-10-CM

## 2021-09-01 DIAGNOSIS — R194 Change in bowel habit: Secondary | ICD-10-CM

## 2021-09-01 DIAGNOSIS — R143 Flatulence: Secondary | ICD-10-CM | POA: Insufficient documentation

## 2021-09-13 ENCOUNTER — Ambulatory Visit: Payer: Medicare HMO

## 2021-09-29 ENCOUNTER — Other Ambulatory Visit: Payer: Medicare HMO

## 2021-09-29 ENCOUNTER — Telehealth: Payer: Self-pay

## 2021-09-29 NOTE — Telephone Encounter (Signed)
Called patient again to schedule him an appointment with Dr. Vicente Males and I was not able to leave him a voicemail.

## 2021-10-04 ENCOUNTER — Other Ambulatory Visit: Payer: Self-pay

## 2021-10-05 ENCOUNTER — Encounter: Payer: Self-pay | Admitting: Gastroenterology

## 2021-10-05 ENCOUNTER — Ambulatory Visit: Payer: Medicare HMO | Admitting: Gastroenterology

## 2021-10-05 VITALS — BP 121/76 | HR 93 | Wt 175.6 lb

## 2021-10-05 DIAGNOSIS — R194 Change in bowel habit: Secondary | ICD-10-CM

## 2021-10-05 MED ORDER — CHOLESTYRAMINE 4 G PO PACK: 4.0000 g | PACK | Freq: Every day | ORAL | 12 refills | Status: AC | PRN

## 2021-10-05 NOTE — Patient Instructions (Addendum)
Stop Nexium, Probiotic, Artificial Sugars  Colonoscopy Scheduled 10/12/21 See instruction handout  New rx Questran 1 packet PRN to be sent to pharmacy.  Activated Charcoal tablets OTC to be taken separately from all other meds.

## 2021-10-05 NOTE — Addendum Note (Signed)
Addended by: Vanetta Mulders on: 10/05/2021 04:25 PM   Modules accepted: Orders

## 2021-10-05 NOTE — Progress Notes (Signed)
Alex Bellows MD, MRCP(U.K) [address]  [place]  [place], [place] [address]  Main: [phone]  Fax: [phone]   Gastroenterology Consultation  Referring Provider:     [name], MD Primary Care Physician:  [name], MD Primary Gastroenterologist:  Dr. Jonathon Lambert  Reason for Consultation: Transfer of care back to our practice        HPI:   Alex Lambert is a 81 y.o. y/o male self-referred to see me.  He was previously seen at our practice back in 2021 when he saw me for acid reflux which had been going on for many years tried Protonix Prilosec Nexium which has not worked very well he is taking his PPI after meals history of a meningioma which they had been contemplating surgery upon at that point of time he had some bloating and abdominal distention he was consuming some 0-calorie Sprite.  Was doing well on over-the-counter Nexium 20 mg as needed.  He was using a wedge pillow.   Plan was to follow-up as needed.  Subsequently he was seen by [facility] clinic gastroenterology on 09/01/2021.  He states that he did not know that he was seeing a different practice and thought he was planning to see [place] and hand saw them.  They put him on Nexium 40 mg a day for belching and gas.  Ordered a gastric emptying study, low FODMAP diet probiotic.  He had mentioned that he had an episode of dysentery while he was in [place] no recent colonoscopy.  Today he has come back to see me as he would like to transfer care back to [place].  He says it was a mistake that he was seen by different practice he did not intend to transfer care at that point of time.  In January he was visiting [place] various universities and an episode of infectious gastroenteritis was treated with an antibiotic, charcoal tablets, Gaviscon and it resolved subsequently he came back to the [place] and since then has been having bloating abdominal discomfort gaseous distention and softer stools than usual.  Denies any  blood.  Using a probiotic, consume some diet soda and has noticed that after consuming the diet so that he has diarrhea followed by abdominal gaseous distention.  Never had a colonoscopy.  He is diabetic.  He has been on meloxicam previously but does not take any presently.  Past Medical History:  Diagnosis Date   Asthma    Brain tumor [facility]) 2007   Optical center on the R side of the brain   Coronary artery disease    stents placed 2002   Diabetes mellitus without complication ([place])    Hypercholesterolemia    Hypertension    Hypothyroidism     Past Surgical History:  Procedure Laterality Date   CARDIAC CATHETERIZATION     2 stents   INCISION AND DRAINAGE     scrotum  2014   TONSILLECTOMY      Prior to Admission medications   Medication Sig Start Date End Date Taking? Authorizing Provider  acetaminophen (TYLENOL) 500 MG tablet Take 500 mg by mouth every 6 (six) hours as needed for mild pain.     [provider]  albuterol (VENTOLIN HFA) 108 (90 Base) MCG/ACT inhaler Inhale 2 puffs into the lungs every 6 (six) hours as needed. 09/01/20 08/24/22  [provider]  aspirin EC 81 MG tablet Take 81 mg by mouth daily.    [provider]  calcium carbonate (  TUMS EX) 750 MG chewable tablet Chew 2 tablets by mouth daily.    [provider]  diltiazem (CARDIZEM CD) 120 MG 24 hr capsule Take 120 mg by mouth daily. Patient not taking: Reported on 07/12/2020 06/22/20   [provider]  esomeprazole (NEXIUM) 40 MG capsule Take 40 mg by mouth daily as needed (reflux).     [provider]  finasteride (PROSCAR) 5 MG tablet Take 1 tablet (5 mg total) by mouth daily. 07/12/20   Hollice Espy, MD  FLOVENT HFA 220 MCG/ACT inhaler 2 puffs in the morning and at bedtime. 09/01/21   [provider]  fluticasone (FLONASE) 50 MCG/ACT nasal spray Place 2 sprays into both nostrils daily. 11/27/19   [provider]  glipiZIDE (GLUCOTROL XL) 2.5 MG  24 hr tablet Take 2.5 mg by mouth daily. 09/15/19   [provider]  glucose blood (ONETOUCH ULTRA) test strip 2 (two) times daily. 09/07/21   [provider]  Lancets (ONETOUCH DELICA PLUS ZOXWRU04V) MISC 2 (two) times daily Use as instructed. 09/07/21   [provider]  levothyroxine (SYNTHROID) 100 MCG tablet Take 100 mcg by mouth daily. 02/06/20   [provider]  meloxicam (MOBIC) 15 MG tablet Take 1 tablet by mouth daily. 02/17/20   [provider]  Multiple Vitamin (MULTIVITAMIN) capsule Take 1 capsule by mouth daily.    [provider]  nystatin-triamcinolone ointment (MYCOLOG) Apply 1 application topically 2 (two) times daily. 02/17/20   Hollice Espy, MD  Omega-3 Fatty Acids (FISH OIL) 1000 MG CAPS Take 1,000 mg by mouth 3 (three) times daily.    [provider]  promethazine-dextromethorphan (PROMETHAZINE-DM) 6.25-15 MG/5ML syrup Take 5 mLs by mouth every 6 (six) hours as needed. 07/03/21   [provider]  sildenafil (VIAGRA) 25 MG tablet Take 1 tablet by mouth as needed. 09/08/21 10/08/21  [provider]  simvastatin (ZOCOR) 20 MG tablet Take 20 mg by mouth daily.    [provider]    No family history on file.   Social History   Tobacco Use   Smoking status: Never   Smokeless tobacco: Never  Substance Use Topics   Alcohol use: No   Drug use: No    Allergies as of 10/05/2021   (No Known Allergies)    Review of Systems:    All systems reviewed and negative except where noted in HPI.   Physical Exam:  There were no vitals taken for this visit. No LMP for male patient. Psych:  Alert and cooperative. Normal mood and affect. General:   Alert,  Well-developed, well-nourished, pleasant and cooperative in NAD Head:  Normocephalic and atraumatic. Eyes:  Sclera clear, no icterus.   Conjunctiva pink. Ears:  Normal auditory acuity. Psych:  Alert and cooperative. Normal mood and  affect.  Imaging Studies: No results found.  Assessment and Plan:   Alex Lambert is a 81 y.o. y/o male has been referred for change in bowel habits.  Used to see me back in 2021 then changed providers to [facility] clinic gastroenterology, he was unaware that he was being seen by different practice and wishes to come back to see [place].  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  Never had a colonoscopy he is very concerned about a change in bowel habits he is unable to reach the restroom often in  time which is affecting the quality of his life.  There is a component possibly of osmotic diarrhea from artificial sugars and probiotics as well as sodas.   Plan 1.  Stop all artificial sugars weakness and sodas. 2.  Commence on Questran 1 packet as needed as needed for diarrhea. 3.  Stool studies for infection inflammation and celiac serology. 4.  Colonoscopy to evaluate lining of the colon evaluate for change in bowel habits as well as rule out microscopic colitis. 5.  At next visit if not doing well will give him a course of Xifaxan to treat IBS diarrhea. 6.  Activated charcoal tablets to be given as needed.  Picture of the medication will be provided 7.  Advised him to contact me via MyChart in 5 to 7 days to give me an update of how he is doing.   I have discussed alternative options, risks & benefits,  which include, but are not limited to, bleeding, infection, perforation,respiratory complication & drug reaction.  The patient agrees with this plan & written consent will be obtained.     Follow up in 8-12 weeks  Dr Alex Bellows MD,MRCP(U.K)

## 2021-10-06 ENCOUNTER — Other Ambulatory Visit: Payer: Self-pay

## 2021-10-06 DIAGNOSIS — R194 Change in bowel habit: Secondary | ICD-10-CM

## 2021-10-06 MED ORDER — NA SULFATE-K SULFATE-MG SULF 17.5-3.13-1.6 GM/177ML PO SOLN: 1.0000 | Freq: Once | ORAL | 0 refills | Status: DC

## 2021-10-06 MED ORDER — NA SULFATE-K SULFATE-MG SULF 17.5-3.13-1.6 GM/177ML PO SOLN: 1.0000 | Freq: Once | ORAL | 0 refills | Status: AC

## 2021-10-08 LAB — CELIAC DISEASE AB SCREEN W/RFX
Antigliadin Abs, IgA: 3 units (ref 0–19)
IgA/Immunoglobulin A, Serum: 126 mg/dL (ref 61–437)
Transglutaminase IgA: <2 U/mL (ref 0–3)

## 2021-10-11 ENCOUNTER — Other Ambulatory Visit: Payer: Self-pay | Admitting: Gastroenterology

## 2021-10-11 ENCOUNTER — Telehealth: Payer: Self-pay

## 2021-10-11 NOTE — Telephone Encounter (Signed)
Pt came into the office to drop off stool specimen for the lab...   Pt states he discussed a medication that he was taken from [place] in the past while at [place] with you and the Lucrezia Starch has a long list of side effects...   He would like to know if you could let him know if the other medication from [place] would work just as well...   Pt also states he does not want to proceed with the colonoscopy and asked for me to cancel colonoscopy....  Please advise

## 2021-10-11 NOTE — Telephone Encounter (Signed)
Charcoal tablets are safe which he had from [place]

## 2021-10-11 NOTE — Telephone Encounter (Signed)
Patient was seen by Dr. Vicente Males on 10/05/2021.

## 2021-10-12 ENCOUNTER — Ambulatory Visit: Admission: RE | Admit: 2021-10-12 | Payer: Medicare HMO | Source: Ambulatory Visit | Admitting: Gastroenterology

## 2021-10-12 ENCOUNTER — Encounter: Admission: RE | Payer: Self-pay | Source: Ambulatory Visit

## 2021-10-12 SURGERY — COLONOSCOPY WITH PROPOFOL
Anesthesia: General

## 2021-10-13 LAB — GI PROFILE, STOOL, PCR

## 2021-10-13 LAB — CALPROTECTIN, FECAL: Calprotectin, Fecal: 85 ug/g (ref 0–120)

## 2021-10-15 LAB — C DIFFICILE, CYTOTOXIN B

## 2021-10-15 LAB — C DIFFICILE TOXINS A+B W/RFLX: C difficile Toxins A+B, EIA: NEGATIVE

## 2021-10-16 NOTE — Telephone Encounter (Signed)
Called patient back but it did not have a voicemail set up. I will send him a MyChart message letting know what Dr. Vicente Males suggested.

## 2021-10-26 ENCOUNTER — Telehealth: Payer: Medicare HMO | Admitting: Gastroenterology

## 2021-12-14 ENCOUNTER — Ambulatory Visit: Payer: Medicare HMO | Admitting: Gastroenterology

## 2021-12-22 ENCOUNTER — Ambulatory Visit: Payer: Medicare HMO | Admitting: Urology

## 2021-12-22 ENCOUNTER — Encounter: Payer: Self-pay | Admitting: Urology

## 2021-12-22 VITALS — BP 151/82 | HR 89 | Ht 67.5 in | Wt 177.0 lb

## 2021-12-22 DIAGNOSIS — L723 Sebaceous cyst: Secondary | ICD-10-CM

## 2021-12-22 NOTE — Progress Notes (Signed)
12/22/2021 3:21 PM   Alex Lambert 17-Aug-1940 [id number]  Referring provider: [name], MD [address] [facility] [place],   [address]  Chief Complaint  Patient presents with   Follow-up    Cyst in scrotum    HPI: 81 year old male who returns with multiple GU concerns.  He was last seen in April 2022.  Notably, he had questionable UVJ lesion which was managed conservatively with serial imaging.  His most recent CT urogram in 2022 showed complete resolution of this, negative cytology and cystoscopy.  He also has a personal history of BPH was offered pharmacotherapy and ultimately elected to try finasteride.  He was worried about hypotension with Flomax.  Lastly, he is noted to have a left suprapubic sebaceous cyst.  He feels like this is getting bigger.  He denies any pain.  Is not bothering him.  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 If you have any questions, ask your nurse or doctor.          STOP taking these medications    diltiazem 120 MG 24 hr capsule Commonly known as: CARDIZEM CD Stopped by: Hollice Espy, MD   meloxicam 15 MG tablet Commonly known as: MOBIC Stopped by: Hollice Espy, MD   nystatin-triamcinolone ointment Commonly  known as: MYCOLOG Stopped by: Hollice Espy, MD   promethazine-dextromethorphan 6.25-15 MG/5ML syrup Commonly known as: PROMETHAZINE-DM Stopped by: Hollice Espy, MD       TAKE these medications    acetaminophen 500 MG tablet Commonly known as: TYLENOL Take 500 mg by mouth every 6 (six) hours as needed for mild pain.   albuterol 108 (90 Base) MCG/ACT inhaler Commonly known as: VENTOLIN HFA Inhale 2 puffs into the lungs every 6 (six) hours as needed.   aspirin EC 81 MG tablet Take 81 mg by mouth daily.   calcium carbonate 750 MG chewable tablet Commonly known as: TUMS EX Chew 2 tablets by mouth daily.   cholestyramine 4 g packet Commonly known as: Questran Take 1 packet (4 g total) by mouth daily as needed.   esomeprazole 40 MG capsule Commonly known as: NEXIUM Take 40 mg by mouth daily as needed (reflux).   finasteride 5 MG tablet Commonly known as: PROSCAR Take 1 tablet (5 mg total) by mouth daily.   Fish Oil 1000 MG Caps Take 1,000 mg by mouth 3 (three) times daily.   Flovent HFA 220 MCG/ACT inhaler Generic drug: fluticasone 2 puffs in the morning and at bedtime.   fluticasone 50 MCG/ACT nasal spray Commonly known as: FLONASE Place 2 sprays into both nostrils  daily.   glipiZIDE 2.5 MG 24 hr tablet Commonly known as: GLUCOTROL XL Take 2.5 mg by mouth daily.   levothyroxine 100 MCG tablet Commonly known as: SYNTHROID Take 100 mcg by mouth daily.   multivitamin capsule Take 1 capsule by mouth daily.   OneTouch Delica Plus [id number] Misc 2 (two) times daily Use as instructed.   OneTouch Ultra test strip Generic drug: glucose blood 2 (two) times daily.   sildenafil 25 MG tablet Commonly known as: VIAGRA Take 1 tablet by mouth as needed.   simvastatin 20 MG tablet Commonly known as: ZOCOR Take 20 mg by mouth daily.        Allergies: No Known Allergies  Family History: No family history on file.  Social History:  reports that he has  never smoked. He has never been exposed to tobacco smoke. He has never used smokeless tobacco. He reports that he does not drink alcohol and does not use drugs.   Physical Exam: BP (!) 151/82   Pulse 89   Ht 5' 7.5" (1.715 m)   Wt 177 lb (80.3 kg)   BMI 27.31 kg/m   Constitutional:  Alert and oriented, No acute distress. HEENT: [place] AT, moist mucus membranes.  Trachea midline, no masses. Cardiovascular: No clubbing, cyanosis, or edema. Respiratory: Normal respiratory effort, no increased work of breathing. GI: Abdomen is soft, nontender, nondistended, no abdominal masses GU: No CVA tenderness Skin: No rashes, bruises or suspicious lesions. Neurologic: Grossly intact, no focal deficits, moving all 4 extremities. Psychiatric: Normal mood and affect.  Laboratory Data: No results found for: "WBC", "HGB", "HCT", "MCV", "PLT"  Lab Results  Component Value Date   CREATININE 0.90 06/22/2020    No results found for: "PSA"  No results found for: "TESTOSTERONE"  No results found for: "HGBA1C"  Urinalysis    Component Value Date/Time   APPEARANCEUR Clear 07/12/2020 1416   GLUCOSEU 2+ (A) 07/12/2020 1416   BILIRUBINUR Negative 07/12/2020 1416   PROTEINUR Negative 07/12/2020 1416   NITRITE Negative 07/12/2020 1416   LEUKOCYTESUR Negative 07/12/2020 1416    Lab Results  Component Value Date   LABMICR See below: 07/12/2020   WBCUA 0-5 07/12/2020   LABEPIT 0-10 07/12/2020   BACTERIA None seen 07/12/2020    Pertinent Imaging: *** Results for orders placed during the hospital encounter of 02/15/14  DG Abd 1 View  Narrative CLINICAL DATA:  Preoperative exam prior to cervical for left-sided kidney stone  EXAM: ABDOMEN - 1 VIEW  COMPARISON:  None.  FINDINGS: There is an 11 x 5 mm calcification projecting over the lower third of the left ureter. No other stones projecting over the ureters are demonstrated. No bladder stones are evident. The bowel gas pattern and bony  structures exhibit no acute abnormalities.  IMPRESSION: There is a calcification consistent with a distal left ureteral stone.   Electronically Signed By: David  [place] On: 02/15/2014 07:52  No results found for this or any previous visit.  No results found for this or any previous visit.  No results found for this or any previous visit.  No results found for this or any previous visit.  No valid procedures specified. Results for orders placed during the hospital encounter of 06/22/20  CT HEMATURIA WORKUP  Narrative CLINICAL DATA:  Follow-up microscopic hematuria  EXAM: CT ABDOMEN AND PELVIS WITHOUT AND WITH CONTRAST  TECHNIQUE: Multidetector CT imaging of the abdomen and pelvis was performed following the standard protocol before and following the bolus administration of intravenous  contrast.  CONTRAST:  153m OMNIPAQUE IOHEXOL 300 MG/ML  SOLN  COMPARISON:  CT abdomen pelvis, 11/09/2019, MR abdomen, 01/22/2020  FINDINGS: Lower chest: No acute abnormality.  Coronary artery calcifications.  Hepatobiliary: No solid liver abnormality is seen. No gallstones, gallbladder wall thickening, or biliary dilatation.  Pancreas: A subcentimeter hyperenhancing lesion of the anterior pancreatic head is again seen, not significantly changed, measuring approximately 6 mm (series 10, image 23). No pancreatic ductal dilatation or surrounding inflammatory changes.  Spleen: Normal in size without significant abnormality.  Adrenals/Urinary Tract: Adrenal glands are unremarkable. Benign, high attenuation hemorrhagic or proteinaceous cyst of the midportion of the right kidney. Benign subcentimeter simple cysts of the left kidney. Kidneys are otherwise normal, without renal calculi, solid lesion, or hydronephrosis. No evidence of urinary tract filling defect on delayed phase imaging. Bladder is unremarkable.  Stomach/Bowel: Stomach is within normal limits. Appendix appears normal.  No evidence of bowel wall thickening, distention, or inflammatory changes.  Vascular/Lymphatic: Aortic atherosclerosis. No enlarged abdominal or pelvic lymph nodes.  Reproductive: Mild Prostatomegaly with median lobe hypertrophy.  Other: Fat containing bilateral inguinal hernias. No abdominopelvic ascites.  Musculoskeletal: No acute or significant osseous findings.  IMPRESSION: 1. No urinary tract calculus or suspicious renal lesion. No evidence of urinary tract filling defect on delayed phase imaging. 2. Prostatomegaly with median lobe hypertrophy. 3. A subcentimeter hyperenhancing lesion of the anterior pancreatic head is again seen, not significantly changed, measuring approximately 6 mm. There is no correlate for this lesion identified on prior MRI, and this remains of uncertain significance. 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 4. Coronary artery disease.  Aortic Atherosclerosis (ICD10-I70.0).   Electronically Signed By: AEddie CandleM.D. On: 06/23/2020 13:01  No results found for this or any previous visit.   Assessment & Plan:    There are no diagnoses linked to this encounter.  No follow-ups on file.  AHollice Espy MD  [facility] [address] [place] [phone]

## 2022-04-21 IMAGING — MR MR HEAD WO/W CM
15 series · 46 of 48 positions shown · IV contrast (gadavist)
Comparison: MRI of the brain January 30, 2019.

CLINICAL DATA: Meningioma follow-up.

EXAM:
MRI HEAD WITHOUT AND WITH CONTRAST
TECHNIQUE: Multiplanar, multiecho pulse sequences of the brain and surrounding
structures were obtained without and with intravenous contrast.
CONTRAST:  7.5mL GADAVIST GADOBUTROL 1 MMOL/ML IV SOLN

[Series 5: ax dwi_tracew · axial · 3.0mm · 0.60mm/px · z∈[-83,+72]mm · 4 of 48 slices shown]
[im 1/48]
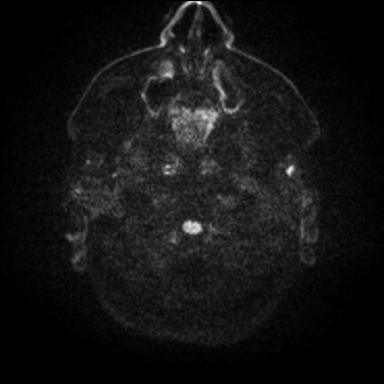
[im 16/48]
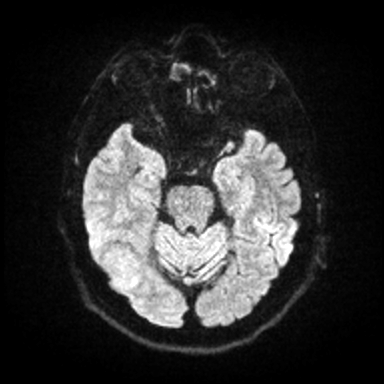
[im 32/48]
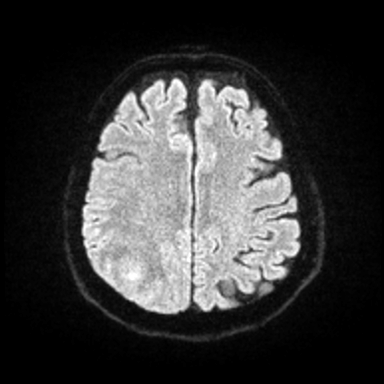
[im 48/48]
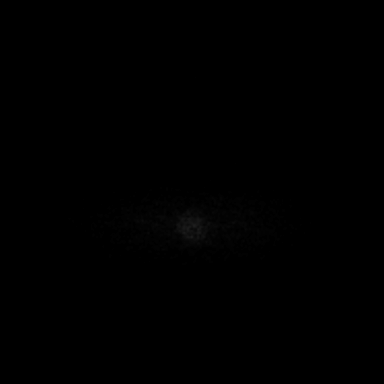

[Series 6: ax dwi_adc · axial · 3.0mm · 0.60mm/px · z∈[-83,+68]mm · 3 of 47 slices shown]
[im 1/47]
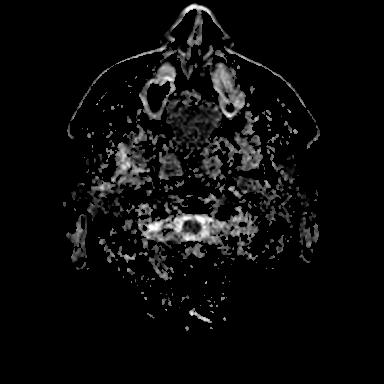
[im 24/47]
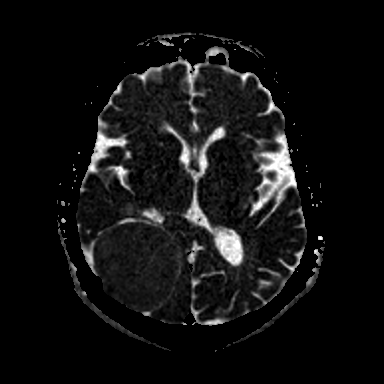
[im 47/47]
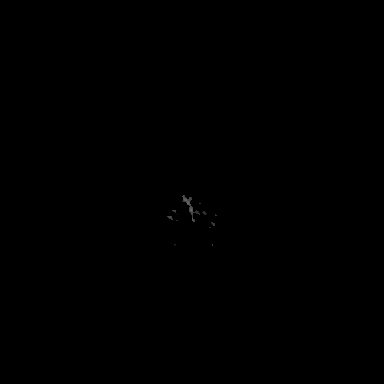

[Series 7: cor dwi_tracew · coronal · 5.0mm · 0.60mm/px · 2 of 40 slices shown]
[im 1/40]
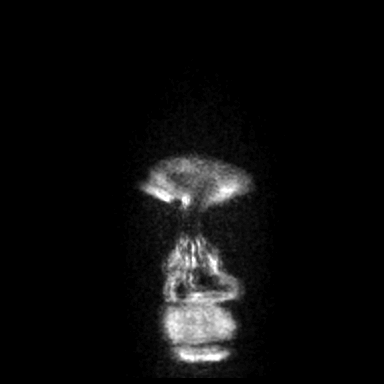
[im 40/40]
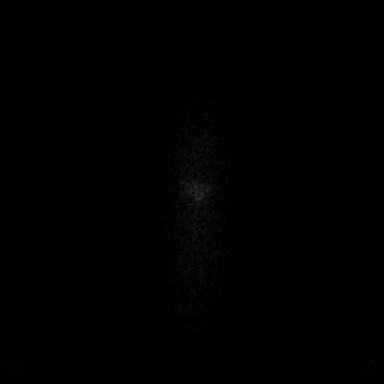

[Series 8: cor dwi_adc · coronal · 5.0mm · 0.60mm/px · 2 of 38 slices shown]
[im 1/38]
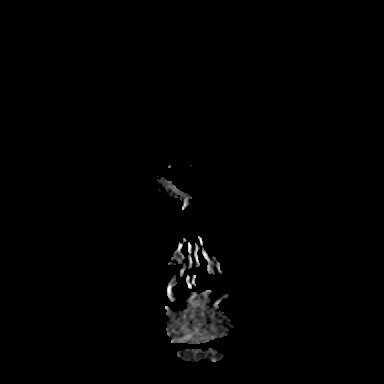
[im 38/38]
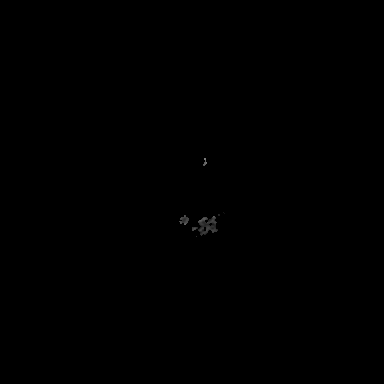

[Series 9: T1 · sagittal · 5.0mm · 0.62mm/px · 1 of 23 slices shown (1 of 2)]
[im 1/23]
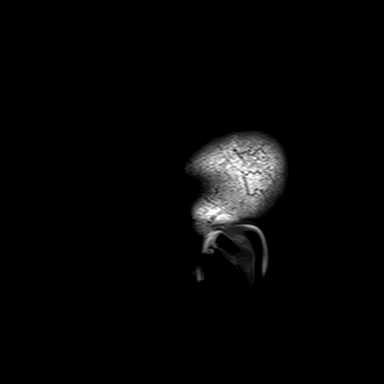

[Series 10: T2 · axial · 5.0mm · 0.53mm/px · 1 of 25 slices shown]
[im 1/25]
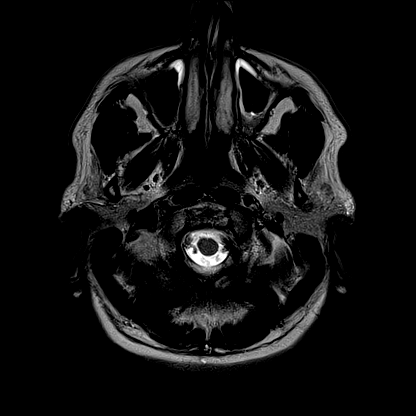

[Series 11: mag_images · axial · 3.0mm · 0.90mm/px · z∈[-95,+82]mm · 3 of 60 slices shown]
[im 1/60]
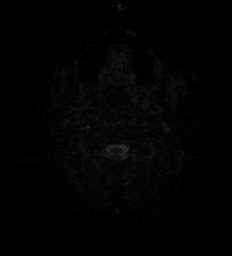
[im 30/60]
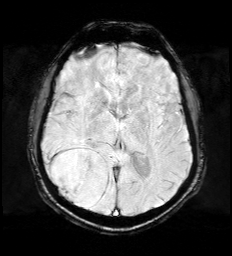
[im 60/60]
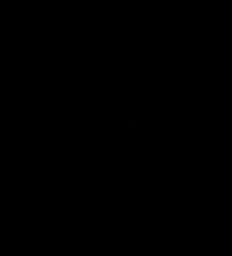

[Series 12: pha_images · axial · 3.0mm · 0.90mm/px · z∈[-95,+79]mm · 3 of 58 slices shown]
[im 1/58]
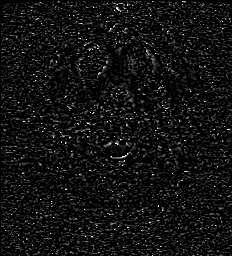
[im 29/58]
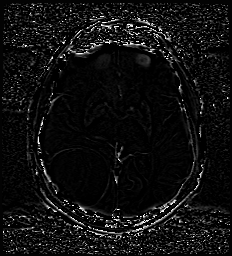
[im 58/58]
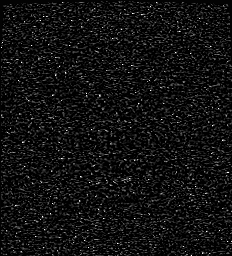

[Series 13: swi_images · axial · 3.0mm · 0.90mm/px · z∈[-95,-8]mm · 2 of 60 slices shown]
[im 1/60]
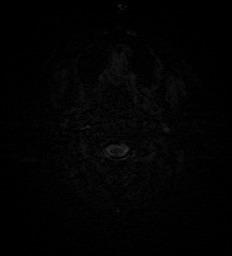
[im 30/60]
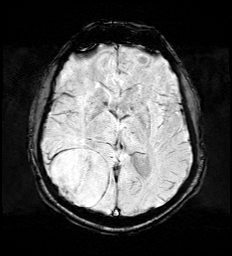

[Series 15: FLAIR · axial · 3.0mm · 0.53mm/px · z∈[-87,+75]mm · 3 of 55 slices shown]
[im 1/55]
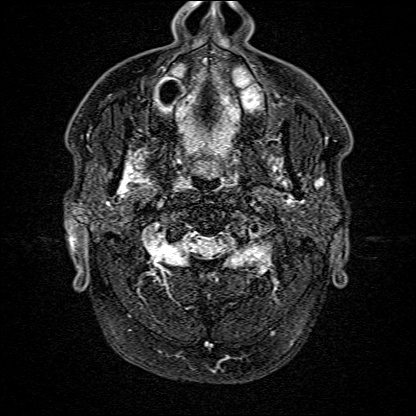
[im 28/55]
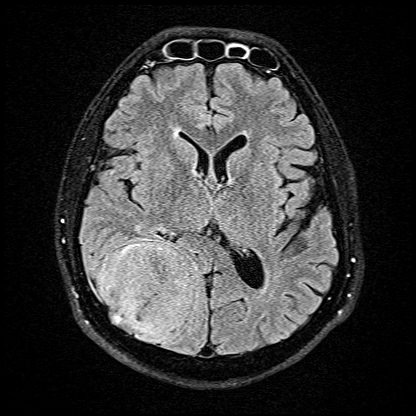
[im 55/55]
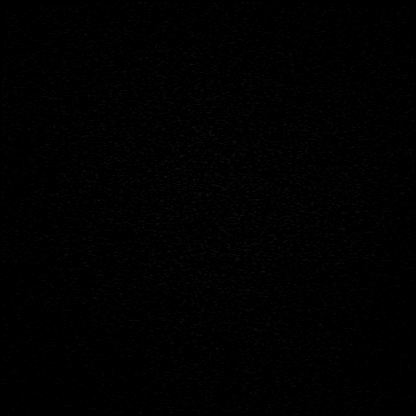

[Series 16: T1 · axial · 1.0mm · 0.98mm/px · z∈[-88,+71]mm · 8 of 160 slices shown (2 of 2)]
[im 1/160]
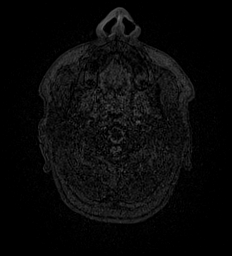
[im 20/160]
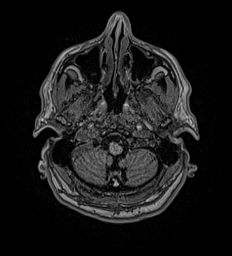
[im 40/160]
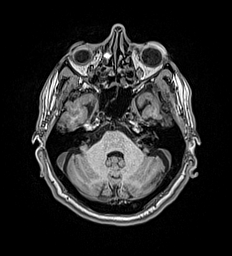
[im 60/160]
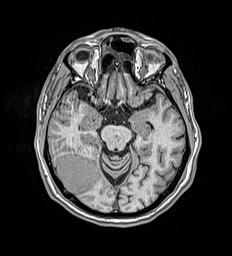
[im 100/160]
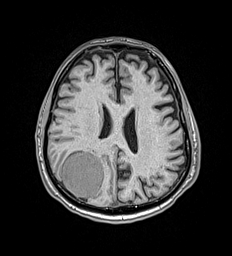
[im 120/160]
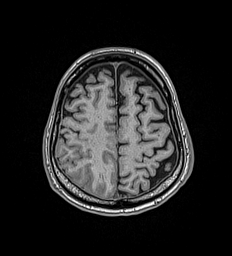
[im 140/160]
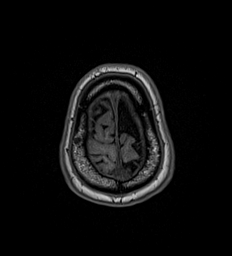
[im 160/160]
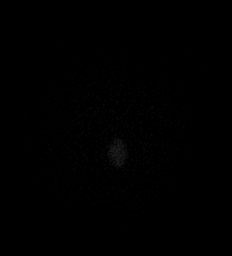

[Series 17: T2 post-contrast · coronal · 5.0mm · 0.57mm/px · 2 of 29 slices shown]
[im 1/29]
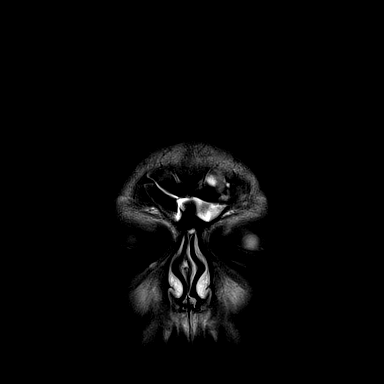
[im 29/29]
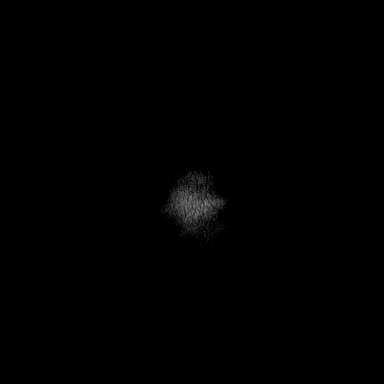

[Series 18: T1 post-contrast · axial · 1.0mm · 0.98mm/px · z∈[-88,+71]mm · 9 of 160 slices shown (1 of 3)]
[im 1/160]
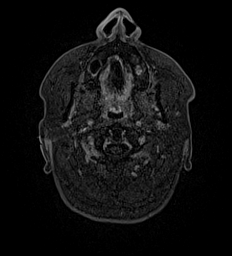
[im 20/160]
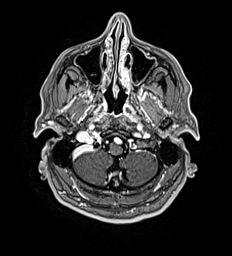
[im 40/160]
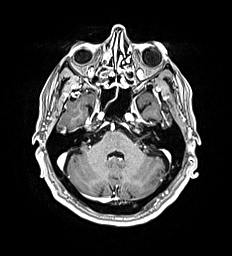
[im 60/160]
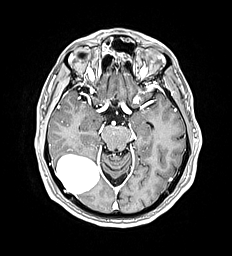
[im 80/160]
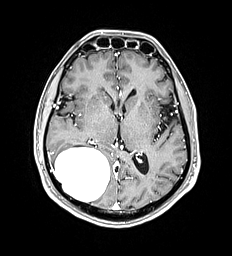
[im 100/160]
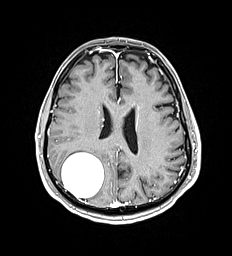
[im 120/160]
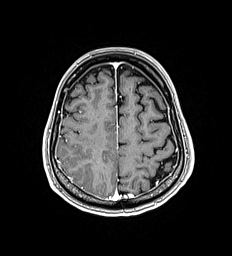
[im 140/160]
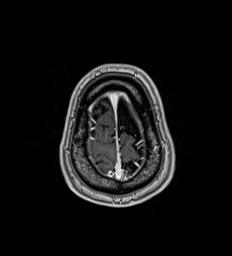
[im 160/160]
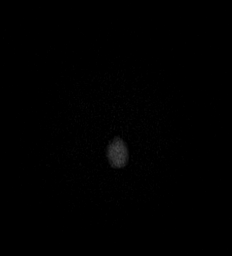

[Series 19: T1 post-contrast · coronal · 5.0mm · 0.57mm/px · 2 of 29 slices shown (2 of 3)]
[im 1/29]
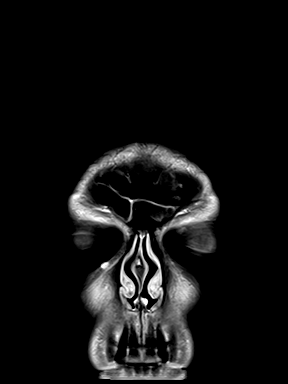
[im 29/29]
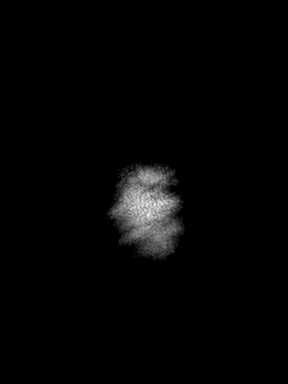

[Series 20: T1 post-contrast · sagittal · 5.0mm · 0.62mm/px · 1 of 23 slices shown (3 of 3)]
[im 1/23]
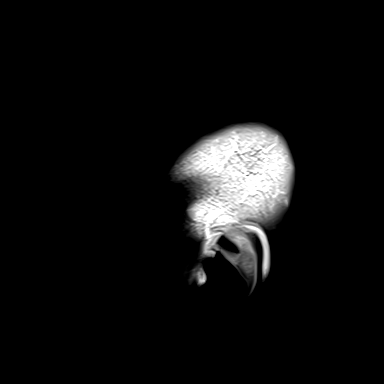

[46 of 48 positions shown; findings below may reference images not displayed]

FINDINGS: Brain: No acute infarction, hemorrhage, hydrocephalus or extra-axial
collection.

Large parietooccipital dural-based, extra-axial enhancing lesion
measuring approximately 5.9 x 5.7 x 5.2 cm compared to 5.8 x 5.6 x
4.9 cm on prior. The lesion is associated with mild periosteal cysts
and a dural tail. There is significant mass effect on the brain
parenchyma with effacement of the adjacent cerebral sulci in near
effacement of the right atrium, without entrapment. There is no
significant parenchymal edema or clear evidence of parenchymal
invasion.

A second dural-based extra-axial enhancing lesion is seen along the
left lesser sphenoid wing measuring approximately 1.5 x 0.4 cm
compared to 1.4 x 0.4 cm on prior.

No new lesion identified.

Vascular: Normal flow voids.

Skull and upper cervical spine: Normal marrow signal.

Sinuses/Orbits: Diffuse mucosal thickening of the paranasal sinuses,
more pronounced in the bilateral ethmoid cells.

Other: None.
IMPRESSION: 1. Stable to minimally enlarged parietooccipital extra-axial lesion,
consistent with a meningioma, measuring up to 5.9 cm. Unchanged mass
effect on the brain parenchyma, without significant parenchymal
edema or clear evidence of parenchymal invasion.
2. A second dural-based extra-axial enhancing lesion along the left
lesser sphenoid wing measuring up to 1.5 cm, stable.
3. Diffuse mucosal thickening of the paranasal sinuses, more
pronounced in the bilateral ethmoid cells.

## 2022-05-31 ENCOUNTER — Other Ambulatory Visit: Payer: Self-pay | Admitting: Neurosurgery

## 2022-05-31 DIAGNOSIS — D329 Benign neoplasm of meninges, unspecified: Secondary | ICD-10-CM

## 2022-06-01 ENCOUNTER — Ambulatory Visit
Admission: RE | Admit: 2022-06-01 | Discharge: 2022-06-01 | Disposition: A | Discharge status: Home / Self Care | Payer: Medicare HMO | Source: Ambulatory Visit | Attending: Neurosurgery | Admitting: Neurosurgery

## 2022-06-01 DIAGNOSIS — D329 Benign neoplasm of meninges, unspecified: Secondary | ICD-10-CM

## 2022-06-01 MED ORDER — GADOBUTROL 1 MMOL/ML IV SOLN: 8.0000 mL | Freq: Once | INTRAVENOUS | Status: DC | PRN

## 2022-09-06 ENCOUNTER — Ambulatory Visit: Payer: Medicare HMO

## 2022-09-07 ENCOUNTER — Ambulatory Visit: Admission: RE | Admit: 2022-09-07 | Payer: Medicare HMO | Source: Ambulatory Visit

## 2022-09-13 ENCOUNTER — Ambulatory Visit
Admission: RE | Admit: 2022-09-13 | Discharge: 2022-09-13 | Disposition: A | Discharge status: Home / Self Care | Payer: Medicare HMO | Source: Ambulatory Visit | Attending: Neurosurgery | Admitting: Neurosurgery

## 2022-09-13 DIAGNOSIS — D329 Benign neoplasm of meninges, unspecified: Secondary | ICD-10-CM | POA: Diagnosis present

## 2022-09-13 MED ORDER — GADOBUTROL 1 MMOL/ML IV SOLN
8.0000 mL | Freq: Once | INTRAVENOUS | Status: AC | PRN
  Administered 2022-09-13: 8 mL via INTRAVENOUS
